# Patient Record
Sex: Female | Born: 1965
Health system: Southern US, Community
[De-identification: ages and names within clinical notes are randomized; demographics above are authoritative.]

## PROBLEM LIST (undated history)

## (undated) DIAGNOSIS — F419 Anxiety disorder, unspecified: Secondary | ICD-10-CM

## (undated) DIAGNOSIS — R87619 Unspecified abnormal cytological findings in specimens from cervix uteri: Secondary | ICD-10-CM

## (undated) DIAGNOSIS — F32A Depression, unspecified: Secondary | ICD-10-CM

## (undated) DIAGNOSIS — B977 Papillomavirus as the cause of diseases classified elsewhere: Secondary | ICD-10-CM

## (undated) DIAGNOSIS — Z8489 Family history of other specified conditions: Secondary | ICD-10-CM

## (undated) DIAGNOSIS — R51 Headache: Secondary | ICD-10-CM

## (undated) HISTORY — PX: SKIN BIOPSY: SHX1

## (undated) HISTORY — DX: Unspecified abnormal cytological findings in specimens from cervix uteri: R87.619

## (undated) HISTORY — DX: Papillomavirus as the cause of diseases classified elsewhere: B97.7

## (undated) HISTORY — PX: VARICOSE VEIN SURGERY: SHX832

## (undated) HISTORY — PX: WISDOM TOOTH EXTRACTION: SHX21

## (undated) HISTORY — DX: Headache: R51

---
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
## 1994-02-22 DIAGNOSIS — Z9889 Other specified postprocedural states: Secondary | ICD-10-CM

## 1994-02-22 HISTORY — DX: Other specified postprocedural states: Z98.890

## 1994-02-22 HISTORY — PX: LEEP: SHX91

## 1999-02-23 HISTORY — PX: RECTAL POLYPECTOMY: SHX2309

## 2000-01-12 ENCOUNTER — Other Ambulatory Visit: Admission: RE | Admit: 2000-01-12 | Discharge: 2000-01-12 | Payer: Self-pay | Admitting: Obstetrics and Gynecology

## 2000-01-12 ENCOUNTER — Encounter (INDEPENDENT_AMBULATORY_CARE_PROVIDER_SITE_OTHER): Payer: Self-pay

## 2000-02-22 ENCOUNTER — Encounter (INDEPENDENT_AMBULATORY_CARE_PROVIDER_SITE_OTHER): Payer: Self-pay | Admitting: Specialist

## 2000-02-22 ENCOUNTER — Other Ambulatory Visit: Admission: RE | Admit: 2000-02-22 | Discharge: 2000-02-22 | Payer: Self-pay | Admitting: Surgery

## 2001-04-14 ENCOUNTER — Other Ambulatory Visit: Admission: RE | Admit: 2001-04-14 | Discharge: 2001-04-14 | Payer: Self-pay | Admitting: Obstetrics and Gynecology

## 2001-08-28 ENCOUNTER — Ambulatory Visit (HOSPITAL_COMMUNITY): Admission: RE | Admit: 2001-08-28 | Discharge: 2001-08-28 | Payer: Self-pay | Admitting: Internal Medicine

## 2001-08-28 ENCOUNTER — Encounter: Payer: Self-pay | Admitting: Internal Medicine

## 2002-06-18 ENCOUNTER — Other Ambulatory Visit: Admission: RE | Admit: 2002-06-18 | Discharge: 2002-06-18 | Payer: Self-pay | Admitting: Obstetrics and Gynecology

## 2003-04-03 ENCOUNTER — Other Ambulatory Visit: Admission: RE | Admit: 2003-04-03 | Discharge: 2003-04-03 | Payer: Self-pay | Admitting: Family Medicine

## 2004-02-23 HISTORY — PX: HYSTEROSCOPY WITH D & C: SHX1775

## 2004-04-21 ENCOUNTER — Other Ambulatory Visit: Admission: RE | Admit: 2004-04-21 | Discharge: 2004-04-21 | Payer: Self-pay | Admitting: Family Medicine

## 2004-04-21 ENCOUNTER — Encounter: Admission: RE | Admit: 2004-04-21 | Discharge: 2004-04-21 | Payer: Self-pay | Admitting: Family Medicine

## 2004-06-23 ENCOUNTER — Encounter (INDEPENDENT_AMBULATORY_CARE_PROVIDER_SITE_OTHER): Payer: Self-pay | Admitting: Specialist

## 2004-06-23 ENCOUNTER — Ambulatory Visit (HOSPITAL_COMMUNITY): Admission: RE | Admit: 2004-06-23 | Discharge: 2004-06-23 | Payer: Self-pay | Admitting: Obstetrics and Gynecology

## 2005-05-10 ENCOUNTER — Other Ambulatory Visit: Admission: RE | Admit: 2005-05-10 | Discharge: 2005-05-10 | Payer: Self-pay | Admitting: Family Medicine

## 2005-05-18 ENCOUNTER — Encounter: Admission: RE | Admit: 2005-05-18 | Discharge: 2005-05-18 | Payer: Self-pay | Admitting: Family Medicine

## 2005-08-22 HISTORY — PX: NOVASURE ABLATION: SHX5394

## 2005-08-24 ENCOUNTER — Ambulatory Visit (HOSPITAL_COMMUNITY): Admission: RE | Admit: 2005-08-24 | Discharge: 2005-08-24 | Payer: Self-pay | Admitting: Gynecology

## 2005-08-24 ENCOUNTER — Encounter (INDEPENDENT_AMBULATORY_CARE_PROVIDER_SITE_OTHER): Payer: Self-pay | Admitting: *Deleted

## 2007-05-26 ENCOUNTER — Ambulatory Visit (HOSPITAL_COMMUNITY): Admission: RE | Admit: 2007-05-26 | Discharge: 2007-05-26 | Payer: Self-pay | Admitting: Family Medicine

## 2007-09-03 ENCOUNTER — Encounter: Admission: RE | Admit: 2007-09-03 | Discharge: 2007-09-03 | Payer: Self-pay | Admitting: Family Medicine

## 2008-07-04 ENCOUNTER — Other Ambulatory Visit: Admission: RE | Admit: 2008-07-04 | Discharge: 2008-07-04 | Payer: Self-pay | Admitting: Obstetrics & Gynecology

## 2008-07-24 ENCOUNTER — Encounter: Admission: RE | Admit: 2008-07-24 | Discharge: 2008-07-24 | Payer: Self-pay | Admitting: Obstetrics & Gynecology

## 2009-01-10 ENCOUNTER — Ambulatory Visit (HOSPITAL_COMMUNITY): Admission: RE | Admit: 2009-01-10 | Discharge: 2009-01-10 | Payer: Self-pay | Admitting: Family Medicine

## 2010-03-16 ENCOUNTER — Encounter: Payer: Self-pay | Admitting: Family Medicine

## 2010-03-26 ENCOUNTER — Other Ambulatory Visit: Payer: Self-pay | Admitting: Obstetrics & Gynecology

## 2010-03-26 DIAGNOSIS — Z1239 Encounter for other screening for malignant neoplasm of breast: Secondary | ICD-10-CM

## 2010-04-02 ENCOUNTER — Ambulatory Visit
Admission: RE | Admit: 2010-04-02 | Discharge: 2010-04-02 | Disposition: A | Discharge status: Home / Self Care | Payer: Commercial Managed Care - PPO | Source: Ambulatory Visit | Attending: Obstetrics & Gynecology | Admitting: Obstetrics & Gynecology

## 2010-04-02 DIAGNOSIS — Z1239 Encounter for other screening for malignant neoplasm of breast: Secondary | ICD-10-CM

## 2010-07-10 NOTE — Op Note (Signed)
NAME:  Taylor Townsend, Taylor Townsend               ACCOUNT NO.:  [id number]   MEDICAL RECORD NO.:  [id number]          PATIENT TYPE:  AMB   LOCATION:  SDC                           FACILITY:  [facility]   PHYSICIAN:  Guy Sandifer. Henderson Cloud, M.D. DATE OF BIRTH:  07/23/1965   DATE OF PROCEDURE:  06/23/2004  DATE OF DISCHARGE:                                 OPERATIVE REPORT   PREOPERATIVE DIAGNOSIS:  Menometrorrhagia.   POSTOPERATIVE DIAGNOSIS:  Menometrorrhagia.   PROCEDURES:  1.  Hysteroscopy with resection of endometrial polyps.  2.  Dilatation and curettage.  3.  Xylocaine 1% paracervical block.   SURGEON:  Guy Sandifer. Henderson Cloud, M.D.   ANESTHESIA:  MAC.   SPECIMENS:  1.  Endometrial polyps.  2.  Endometrial curettings.   ESTIMATED BLOOD LOSS:  Less than 100 mL.   I&O OF SORBITOL DISTENDING MEDIA:  50 mL deficit.   INDICATIONS AND CONSENT:  This patient is a 45 year old divorced white  female, G1, P1, with increasingly heavy menses.  Details are dictated in the  history and physical.  Hysteroscopy with resectoscope, dilatation and  curettage, has been discussed with the patient preoperatively.  Potential  risks and complications are discussed preoperatively, including but not  limited to infection, uterine perforation, bowel, bladder or ureteral  damage, bleeding requiring transfusion of blood products and possible  transfusion reaction, HIV and hepatitis acquisition, DVT, PE and pneumonia,  and recurrent abnormal bleeding.  All questions have been answered and  consent is signed on the chart.   FINDINGS:  There are two small polypoid processes on the posterior wall of  the uterine canal.   PROCEDURE:  The patient was taken to the operating room, where she is  identified, placed in the dorsal supine position, and given intravenous  sedation.  She was then placed in the dorsal lithotomy position, where she  was gently prepped, the bladder was straight-catheterized and she was draped  in a sterile  fashion.  Examination reveals an anteverted uterus.  The  bivalve speculum was then placed in the vagina and the anterior  cervical  lip is injected with 1% Xylocaine and grasped with a single-tooth tenaculum.  A paracervical block is then placed at the 2, 4, 5, 7, 8 and 10 o'clock  positions with approximately 20 mL total of 1% plain Xylocaine.  The cervix  is then gently progressively dilated to a 31 Pratt dilator.  The  resectoscope is then placed in the endocervical canal and advanced under  direct visualization using sorbitol distending media.  The above findings  were noted.  Using a single right  angle wire loop, the polypoid processes are resected without difficulty.  Reinspection reveals the remainder of the cavity to appear normal.  The  hysteroscope is removed and sharp curettage is carried out.  Good hemostasis  is noted.  All instruments are removed.  All counts correct.  The patient is  taken to the recovery room in stable condition.      JET/MEDQ  D:  06/23/2004  T:  06/23/2004  Job:  [id number]

## 2010-07-10 NOTE — H&P (Signed)
NAME:  Taylor Townsend, Taylor Townsend               ACCOUNT NO.:  [id number]   MEDICAL RECORD NO.:  [id number]           PATIENT TYPE:   LOCATION:                                 FACILITY:   PHYSICIAN:  Guy Sandifer. Henderson Cloud, M.D.      DATE OF BIRTH:   DATE OF ADMISSION:  06/23/2004  DATE OF DISCHARGE:                                HISTORY & PHYSICAL   CHIEF COMPLAINT:  Heavy vaginal bleeding.   HISTORY OF PRESENT ILLNESS:  This patient is a 45 year old divorced white  female, G1, P1, who has had increasingly heavy menses.  Pelvic ultrasound on  May 13, 2004 reveals the uterus measuring 9.7 x 4.3 x 5.5 cm.  Sonohystogram reveals at least 2 polypoid-shaped masses measuring 8 and 14  mm respectively.  There is a 1.6 cm simple cyst of the left ovary.  After  discussion of the options of management, she is being admitted for  hysteroscopy with resectoscope and D&C.  Potential risks and complications  have been reviewed preoperatively.   PAST MEDICAL HISTORY:  Cervical dysplasia.   PAST SURGICAL HISTORY:  Negative.   OBSTETRIC HISTORY:  Vaginal delivery x1.   FAMILY HISTORY:  Chronic hypertension in father and paternal grandmother.  Uterine cancer in maternal grandmother.   SOCIAL HISTORY:  Drinks alcohol on occasion.  Denies tobacco or drug abuse.   REVIEW OF SYSTEMS:  NEUROLOGIC:  Denies headache.  PULMONARY:  Denies  shortness of breath.  CARDIOVASCULAR:  Denies chest pain.  GI:  Denies  changes in bowel habits.   PHYSICAL EXAMINATION:  VITAL SIGNS:  Height 5 feet, 8.5 inches.  Blood  pressure 110/76.  HEENT:  Without thyromegaly.  LUNGS:  Clear to auscultation.  HEART:  Regular rate and rhythm.  BACK:  Without CVA tenderness.  BREASTS:  No examined.  ABDOMEN:  Soft, nontender, without masses.  PELVIC:  Vulva, vaginal, and cervix without lesions.  Uterus is anteverted,  upper limits of normal size, mobile, nontender.  Adnexa nontender without  masses.  EXTREMITIES:  Neurological exam  grossly within normal limits.   ASSESSMENT:  Menometrorrhagia.   PLAN:  Hysteroscopy D&C with resectoscope.      JET/MEDQ  D:  06/22/2004  T:  06/22/2004  Job:  [id number]

## 2010-07-10 NOTE — Op Note (Signed)
NAME:  Taylor Townsend, Taylor Townsend               ACCOUNT NO.:  [id number]   MEDICAL RECORD NO.:  [id number]          PATIENT TYPE:  AMB   LOCATION:  SDC                           FACILITY:  [facility]   PHYSICIAN:  Ivor Costa. Farrel Gobble, M.D. DATE OF BIRTH:  [birth year]   DATE OF PROCEDURE:  08/24/2005  DATE OF DISCHARGE:                                 OPERATIVE REPORT   PREOPERATIVE DIAGNOSIS:  Menorrhagia.   POSTOPERATIVE DIAGNOSIS:  Menorrhagia.   PROCEDURE:  D&C hysteroscopy.  Additional procedure was Novasure ablation.   SURGEON:  Ivor Costa. Farrel Gobble, M.D.   ANESTHESIA:  General plus paracervical block of 10 mL of 0.25% Marcaine.   ESTIMATED BLOOD LOSS:  Minimal.   I&O DEFICIT:  3% sorbitol solution 50.   FINDINGS:  Anteflexed uterus that measured 9 cm, the cervix measured 4, the  cavity therefore being 5, there were normal contours.   PATHOLOGY:  Endometrial curettings.   COMPLICATIONS:  None.   PROCEDURE:  The patient was taken to operating room.  General anesthesia was  induced, then placed in dorsal lithotomy position, prepped and draped usual  sterile fashion.  A bivalve speculum was placed in the vagina.  The cervix  was visualized and stabilized with a single-tooth tenaculum. 10 mL of 0.25%  Marcaine was then injected circumferentially around the cervix.  The uterine  sound was used to measure the cavity and confirm orientation.  The larger 19-  mm sound was then used to palpate the endocervical os.  Four measurements  were taken, two with the smaller dilator, one with the Hegar, the Hegar  measuring 4 and 4 was therefore used for the case.  The cervix was then  dilated to 21.  The cervix was grasped inferiorly to straighten the canal.  The cavity was inspected, noted to be unremarkable other than what appeared  to be artifact and dilators.  A gentle scraping was performed for specimen.  The cervix was then resounded for 4 using the number 8 Hegar dilator.  The  Novasure instrument was  then advanced through the cervix and towards the  fundus after it had been set for cervical length of 4.  The Novasure was  released into the cavity and gentle traction back showed good seating, the  cavity width was noted to be 3.1.  The Novasure was then seated using the  usual maneuvers.  The CO2 test performed confirmed good seal.  The  instrument was then turned on with a power of 85 for a total burn time of 2  minutes, after which the instrument was released and removed from the  cavity.  The screen was inspected.  The diagnostic scope was then advanced  back  through the cavity.  A good thermal burn was appreciated throughout with a  good margin at the endocervix.  The patient tolerated the procedure well.  Sponge, lap and needle counts were correct and she was extubated in OR,  transferred to the PACU in stable condition.      Ivor Costa. Farrel Gobble, M.D.  Electronically Signed     THL/MEDQ  D:  08/24/2005  T:  08/24/2005  Job:  [id number]

## 2012-04-05 ENCOUNTER — Encounter: Payer: Self-pay | Admitting: Certified Nurse Midwife

## 2012-05-15 ENCOUNTER — Encounter: Payer: Self-pay | Admitting: Certified Nurse Midwife

## 2012-05-15 ENCOUNTER — Ambulatory Visit (INDEPENDENT_AMBULATORY_CARE_PROVIDER_SITE_OTHER): Payer: Commercial Managed Care - PPO | Admitting: Certified Nurse Midwife

## 2012-05-15 VITALS — BP 114/72 | HR 60 | Resp 16

## 2012-05-15 DIAGNOSIS — Z711 Person with feared health complaint in whom no diagnosis is made: Secondary | ICD-10-CM

## 2012-05-15 NOTE — Progress Notes (Signed)
46 yrsDivorcedCaucasian femaleG1P1001 here for f/u positive chlamydia treated with azithromycin, initiated on 02-14-12.  Completed all med as directed.  Not sexually active.  Patient's last menstrual period was 04/20/2012.                                    Contraception:The current method of family planning is OCP (estrogen/progesterone).                                                                                                                                                                                                                                                           Patient:She denies abnormal vaginal bleeding, discharge or unusual pelvic pain, no dysuria, frequency or hematuria.                                                                                                                                     General:Healthy female time, place and person  X 3 Abdomen: soft non-tender Lymph groin:Normal   Pelvic Exam:EGBUS normal. Vulva reveals no erythema, lesions or atrophic changes. Vagina normal, no lesions, polyps or discharge. Cervix is normal, smooth pink mucosa, no friability, nontender, no CMT, no lesions, no polyps. Cervical ectropion negative, stenosis negative, Nabothian cysts negative. Uterus not palpated: Adnexae:not palpated  Specimens collected                    Assessment:STD rescreen [facility]) Contraception: LoLoestrin Fe  (has rx)                                         Plan :Reviewed findings GC,Chl,   Hepatitis C antibody         STD prevention reviewed.  Questions addressed.         Repeat  STD screen 9 months, at aex Instructions given to patient             Copy printed for patient   :   :      :       Red Flags:  Missed period:  Recent antibiotics:  Possible STD exposure:  IUD:  Diabetes:

## 2012-05-16 LAB — HEPATITIS C ANTIBODY: HCV Ab: NEGATIVE

## 2012-05-16 NOTE — Progress Notes (Signed)
Quick Note:  Reviewed result. Please notify patient result neg. Debbi ______

## 2012-05-26 ENCOUNTER — Encounter: Payer: Self-pay | Admitting: Certified Nurse Midwife

## 2012-05-29 ENCOUNTER — Other Ambulatory Visit: Payer: Self-pay

## 2012-05-29 DIAGNOSIS — Z1231 Encounter for screening mammogram for malignant neoplasm of breast: Secondary | ICD-10-CM

## 2012-06-16 ENCOUNTER — Other Ambulatory Visit: Payer: Self-pay | Admitting: Obstetrics & Gynecology

## 2012-06-16 NOTE — Telephone Encounter (Signed)
Call to patient since Rx for Treximet was given in 01-2012.  Patient states it is expensive so she prefers the generic Sumatriptan and adding Naproxen.  Patient has enough until next menses so aware we will confirm with Debbi and complete on Monday.

## 2012-06-16 NOTE — Telephone Encounter (Signed)
Inbasket request for Imitrex chart shows she was changed to Treximet in Dec with DL please advise. Also has Naproxen 500 mg request.

## 2012-06-20 NOTE — Telephone Encounter (Signed)
rxs approved see note

## 2012-06-20 NOTE — Telephone Encounter (Signed)
OK to change to generic form with refills to next aex

## 2012-06-20 NOTE — Telephone Encounter (Signed)
Araceli Bouche, CMA at 06/20/2012  5:11 PM    Status: Signed                   rxs approved see note         Verner Chol, CNM at 06/20/2012  8:11 AM    Status: Signed                   OK to change to generic form with refills to next aex         Armen Pickup, RN at 06/16/2012  4:58 PM    Status: Signed                   Call to patient since Rx for Treximet was given in 01-2012.  Patient states it is expensive so she prefers the generic Sumatriptan and adding Naproxen.  Patient has enough until next menses so aware we will confirm with Debbi and complete on Monday.         Dorinda Hill, CMA at 06/16/2012  3:47 PM    Status: Signed                   Inbasket request for Imitrex chart shows she was changed to Treximet in Dec with DL please advise. Also has Naproxen 500 mg request.

## 2012-06-26 ENCOUNTER — Ambulatory Visit
Admission: RE | Admit: 2012-06-26 | Discharge: 2012-06-26 | Disposition: A | Discharge status: Home / Self Care | Payer: 59 | Source: Ambulatory Visit

## 2012-06-26 DIAGNOSIS — Z1231 Encounter for screening mammogram for malignant neoplasm of breast: Secondary | ICD-10-CM

## 2013-05-10 ENCOUNTER — Ambulatory Visit: Payer: Self-pay | Admitting: Obstetrics & Gynecology

## 2013-05-10 ENCOUNTER — Encounter: Payer: Self-pay | Admitting: Obstetrics & Gynecology

## 2013-05-21 ENCOUNTER — Encounter: Payer: Self-pay | Admitting: Obstetrics & Gynecology

## 2013-05-21 ENCOUNTER — Ambulatory Visit (INDEPENDENT_AMBULATORY_CARE_PROVIDER_SITE_OTHER): Payer: Commercial Managed Care - PPO | Admitting: Obstetrics & Gynecology

## 2013-05-21 VITALS — BP 112/62 | HR 60 | Resp 16 | Ht 69.0 in | Wt 140.2 lb

## 2013-05-21 DIAGNOSIS — Z01419 Encounter for gynecological examination (general) (routine) without abnormal findings: Secondary | ICD-10-CM

## 2013-05-21 DIAGNOSIS — Z23 Encounter for immunization: Secondary | ICD-10-CM

## 2013-05-21 DIAGNOSIS — Z0000 Encounter for general adult medical examination without abnormal findings: Secondary | ICD-10-CM

## 2013-05-21 DIAGNOSIS — Z124 Encounter for screening for malignant neoplasm of cervix: Secondary | ICD-10-CM

## 2013-05-21 LAB — POCT URINALYSIS DIPSTICK
Bilirubin, UA: NEGATIVE
Blood, UA: NEGATIVE
Glucose, UA: NEGATIVE
Ketones, UA: NEGATIVE
Leukocytes, UA: NEGATIVE
Nitrite, UA: NEGATIVE
Protein, UA: NEGATIVE
Urobilinogen, UA: NEGATIVE
pH, UA: 7

## 2013-05-21 LAB — HEMOGLOBIN, FINGERSTICK: Hemoglobin, fingerstick: 13.5 g/dL (ref 12.0–16.0)

## 2013-05-21 NOTE — Patient Instructions (Signed)

## 2013-05-21 NOTE — Progress Notes (Signed)
48 y.o. G1P1001 DivorcedCaucasianF here for annual exam.  Daughter is studying abroad in [place].  Going to go visit daughter after her program is over.      Cycles are changing.  Has had cycles every 3 to 6 weeks.  Flow is changing again.  Migraines are so much better.  Hasn't taken any medication for headaches in 9 months.  Stopped the LoLoestrin a year ago and this helped as well.  Dr. Sabra Heck does blood work.   Patient's last menstrual period was 04/07/2013.          Sexually active: yes  The current method of family planning is vasectomy.    Exercising: yes  somewhat walk and jog Smoker:  no  Health Maintenance: Pap:  02/08/12-WNL History of abnormal Pap:  Yes, h/o cryosurgery and LEEP MMG:  06/26/12 3D-normal Colonoscopy:  none BMD:   Age 30-normal TDaP:  2005 Screening Labs: Dr. Kathyrn Lass, Hb today: 13.5, Urine today: PH-7.0   reports that she has never smoked. She has never used smokeless tobacco. She reports that she drinks alcohol. She reports that she does not use illicit drugs.  Past Medical History  Diagnosis Date  . Headache(784.0)     menstrual migraines  . HPV (human papilloma virus) infection     condyloma  . Abnormal Pap smear of cervix     Past Surgical History  Procedure Laterality Date  . Novasure ablation  7/07  . Polypectomy  2006  . Rectal polypectomy  2001    anal polyp  . 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 Cervix lesion destruction  1984  . Gynecologic cryosurgery    . Varicose vein surgery      Current Outpatient Prescriptions  Medication Sig Dispense Refill  . cetirizine (ZYRTEC) 10 MG tablet Take 10 mg by mouth daily. As needed      . fluticasone (FLONASE) 50 MCG/ACT nasal spray Place 2 sprays into the nose daily.      . polyethylene glycol (MIRALAX / GLYCOLAX) packet Take 17 g by mouth daily.      Cyndie Chime Estrad-Fe Biphas (LO LOESTRIN FE PO) Take by mouth daily.      . SUMAtriptan-naproxen (TREXIMET) 85-500 MG per tablet Take 1 tablet by  mouth every 2 (two) hours as needed for migraine.       No current facility-administered medications for this visit.    Family History  Problem Relation Age of Onset  . Cancer Maternal Grandmother     endometrial  . Breast cancer Mother   . Pancreatic cancer Maternal Grandfather   . Hypertension Mother   . Hypertension Father   . Hypertension Other     all grandparents  . Coronary artery disease Mother   . Coronary artery disease Father   . Bipolar disorder Mother   . Bipolar disorder Brother   . Osteoporosis Mother     ROS:  Pertinent items are noted in HPI.  Otherwise, a comprehensive ROS was negative.  Exam:   BP 112/62  Pulse 60  Resp 16  Ht 5\' 9"  (1.753 m)  Wt 140 lb 3.2 oz (63.594 kg)  BMI 20.69 kg/m2  LMP 04/07/2013  Weight change:-3#  Height: 5\' 9"  (175.3 cm)  Ht Readings from Last 3 Encounters:  05/21/13 5\' 9"  (1.753 m)    General appearance: alert, cooperative and appears stated age Head: Normocephalic, without obvious abnormality, atraumatic Neck: no adenopathy, supple, symmetrical, trachea midline and thyroid normal to inspection and  palpation Lungs: clear to auscultation bilaterally Breasts: normal appearance, no masses or tenderness Heart: regular rate and rhythm Abdomen: soft, non-tender; bowel sounds normal; no masses,  no organomegaly Extremities: extremities normal, atraumatic, no cyanosis or edema Skin: Skin color, texture, turgor normal. No rashes or lesions Lymph nodes: Cervical, supraclavicular, and axillary nodes normal. No abnormal inguinal nodes palpated Neurologic: Grossly normal   Pelvic: External genitalia:  no lesions              Urethra:  normal appearing urethra with no masses, tenderness or lesions              Bartholins and Skenes: normal                 Vagina: normal appearing vagina with normal color and discharge, no lesions              Cervix: no lesions              Pap taken: yes Bimanual Exam:  Uterus:  normal size,  contour, position, consistency, mobility, non-tender              Adnexa: normal adnexa and no mass, fullness, tenderness               Rectovaginal: Confirms               Anus:  normal sphincter tone, no lesions  A:  Well Woman with normal exam Migraines, much improved H/O abnormal pap with cryo and then LEEP 1996  P:   Mammogram yearly pap smear obtained Tdap today Pt does not need RX for Treximet.  Will call if does. return annually or prn  An After Visit Summary was printed and given to the patient.

## 2013-05-23 ENCOUNTER — Ambulatory Visit: Payer: Commercial Managed Care - PPO | Admitting: Obstetrics & Gynecology

## 2013-05-23 LAB — IPS PAP SMEAR ONLY

## 2013-12-10 ENCOUNTER — Other Ambulatory Visit: Payer: Self-pay

## 2013-12-10 DIAGNOSIS — Z1231 Encounter for screening mammogram for malignant neoplasm of breast: Secondary | ICD-10-CM

## 2013-12-24 ENCOUNTER — Encounter: Payer: Self-pay | Admitting: Obstetrics & Gynecology

## 2013-12-27 ENCOUNTER — Ambulatory Visit
Admission: RE | Admit: 2013-12-27 | Discharge: 2013-12-27 | Disposition: A | Discharge status: Home / Self Care | Payer: 59 | Source: Ambulatory Visit

## 2013-12-27 DIAGNOSIS — Z1231 Encounter for screening mammogram for malignant neoplasm of breast: Secondary | ICD-10-CM

## 2014-02-14 ENCOUNTER — Other Ambulatory Visit: Payer: Self-pay | Admitting: Obstetrics & Gynecology

## 2014-02-18 NOTE — Telephone Encounter (Signed)
Medication refill request: Imitrex 100 mg  Last AEX:  05/21/13 with Dr. Sabra Heck  Next AEX: 05/31/14 with Dr. Sabra Heck Last MMG (if hormonal medication request): N/A  Refill authorized: #[id number] rfs, please advise.

## 2014-05-31 ENCOUNTER — Ambulatory Visit: Payer: Commercial Managed Care - PPO | Admitting: Obstetrics & Gynecology

## 2014-08-05 ENCOUNTER — Other Ambulatory Visit: Payer: Self-pay | Admitting: Gastroenterology

## 2014-09-23 ENCOUNTER — Other Ambulatory Visit: Payer: Self-pay | Admitting: *Deleted

## 2014-09-23 NOTE — Telephone Encounter (Signed)
Opened in error

## 2014-12-17 ENCOUNTER — Ambulatory Visit (INDEPENDENT_AMBULATORY_CARE_PROVIDER_SITE_OTHER): Payer: 59 | Admitting: Obstetrics & Gynecology

## 2014-12-17 ENCOUNTER — Encounter: Payer: Self-pay | Admitting: Obstetrics & Gynecology

## 2014-12-17 VITALS — BP 110/78 | HR 60 | Resp 16 | Ht 69.25 in | Wt 151.0 lb

## 2014-12-17 DIAGNOSIS — N926 Irregular menstruation, unspecified: Secondary | ICD-10-CM | POA: Diagnosis not present

## 2014-12-17 DIAGNOSIS — Z124 Encounter for screening for malignant neoplasm of cervix: Secondary | ICD-10-CM

## 2014-12-17 DIAGNOSIS — N393 Stress incontinence (female) (male): Secondary | ICD-10-CM | POA: Diagnosis not present

## 2014-12-17 DIAGNOSIS — Z01419 Encounter for gynecological examination (general) (routine) without abnormal findings: Secondary | ICD-10-CM

## 2014-12-17 DIAGNOSIS — Z0000 Encounter for general adult medical examination without abnormal findings: Secondary | ICD-10-CM | POA: Diagnosis not present

## 2014-12-17 MED ORDER — SUMATRIPTAN SUCCINATE 100 MG PO TABS: 100.0000 mg | ORAL_TABLET | ORAL | Status: DC | PRN

## 2014-12-17 MED ORDER — NAPROXEN 500 MG PO TABS
ORAL_TABLET | ORAL | Status: DC
Start: 2014-12-17 — End: 2017-05-16

## 2014-12-17 NOTE — Progress Notes (Signed)
49 y.o. G57P1001 Divorced CaucasianF here for annual exam.  Pt reports cycles are changing.  She can skip up to two months and the subsequent month will then have two or three episodes of light spotting.  Rarely has a heavy day of bleeding now.  Pt anxious about presence of abnormal cells causing bleeding.  H/O endometrial ablation.  Reports urinary incontinence has worsened.  She is ready to consider treatment options.  Surgery vs PT reviewed.  Recommend PT for considering surgery.  Pt in agreement.  Order will be placed.    PCP:  Kathyrn Lass.  Did blood work in March.  Patient's last menstrual period was 11/10/2014.          Sexually active: Yes.    The current method of family planning is vasectomy.    Exercising: Yes.    walking and jogging Smoker:  no  Health Maintenance: Pap: 02/08/12-WNL History of abnormal Pap: Yes, h/o cryosurgery and LEEP MMG:11/15 3D-normal Colonoscopy: Dr. Watt Climes.  One adenomatous polyp.  Follow up five years.  BMD: Age 14-normal TDaP: 2015 Screening Labs: Dr. Kathyrn Lass, Hb today: 13.5, Urine today: PH-7.0   reports that she has never smoked. She has never used smokeless tobacco. She reports that she drinks alcohol. She reports that she does not use illicit drugs.  Past Medical History  Diagnosis Date  . Headache(784.0)     menstrual migraines  . HPV (human papilloma virus) infection     condyloma  . Abnormal Pap smear of cervix     Past Surgical History  Procedure Laterality Date  . Novasure ablation  7/07  . Hysteroscopy w/d&c  2006  . Rectal polypectomy  2001    anal polyp  . 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 Gynecologic cryosurgery  1984  . Varicose vein surgery    . Skin biopsy      area froze off-left shoulder    Current Outpatient Prescriptions  Medication Sig Dispense Refill  . cetirizine (ZYRTEC) 10 MG tablet Take 10 mg by mouth daily. As needed    . fluticasone (FLONASE) 50 MCG/ACT nasal spray Place 2 sprays into the nose daily.     Marland Kitchen PATADAY 0.2 % SOLN   4  . polyethylene glycol (MIRALAX / GLYCOLAX) packet Take 17 g by mouth daily.    . SUMAtriptan (IMITREX) 100 MG tablet TAKE 1 TABLET BY MOUTH AT ONSET OF HEADACHE. MAY REPEAT IN 2 HOURS. MAX DOSE 200MG /24 HOURS. 12 tablet 2   No current facility-administered medications for this visit.    Family History  Problem Relation Age of Onset  . Cancer Maternal Grandmother     endometrial  . Breast cancer Mother   . Pancreatic cancer Maternal Grandfather   . Hypertension Mother   . Hypertension Father   . Hypertension Other     all grandparents  . Coronary artery disease Mother   . Coronary artery disease Father   . Bipolar disorder Mother   . Bipolar disorder Brother   . Osteoporosis Mother     ROS:  Pertinent items are noted in HPI.  Otherwise, a comprehensive ROS was negative.  Exam:   BP 110/78 mmHg  Pulse 60  Resp 16  Ht 5' 9.25" (1.759 m)  Wt 151 lb (68.493 kg)  BMI 22.14 kg/m2  LMP 11/10/2014  :   Height: 5' 9.25" (175.9 cm)  Ht Readings from Last 3 Encounters:  12/17/14 5' 9.25" (1.759 m)  05/21/13 5\' 9"  (1.753  m)    General appearance: alert, cooperative and appears stated age Head: Normocephalic, without obvious abnormality, atraumatic Neck: no adenopathy, supple, symmetrical, trachea midline and thyroid normal to inspection and palpation Lungs: clear to auscultation bilaterally Breasts: normal appearance, no masses or tenderness Heart: regular rate and rhythm Abdomen: soft, non-tender; bowel sounds normal; no masses,  no organomegaly Extremities: extremities normal, atraumatic, no cyanosis or edema Skin: Skin color, texture, turgor normal. No rashes or lesions Lymph nodes: Cervical, supraclavicular, and axillary nodes normal. No abnormal inguinal nodes palpated Neurologic: Grossly normal   Pelvic: External genitalia:  no lesions              Urethra:  normal appearing urethra with no masses, tenderness or lesions               Bartholins and Skenes: normal                 Vagina: normal appearing vagina with normal color and discharge, no lesions              Cervix: no lesions              Pap taken: Yes.   Bimanual Exam:  Uterus:  normal size, contour, position, consistency, mobility, non-tender              Adnexa: normal adnexa and no mass, fullness, tenderness               Rectovaginal: Confirms               Anus:  normal sphincter tone, no lesions  Endometrial biopsy recommended.  Discussed with patient.  Verbal and written consent obtained.   Procedure:  Speculum placed.  Cervix visualized and cleansed with betadine prep.  A single toothed tenaculum was not applied to the anterior lip of the cervix.  Endometrial pipelle was advanced through the cervix into the endometrial cavity without difficulty.  Pipelle passed to 6cm.  Suction applied and pipelle removed with good tissue sample obtained.  Tenculum removed.  No bleeding noted.  Patient tolerated procedure well.  Chaperone was present for exam.  A:  Well Woman with normal exam Migraines, much improved H/O abnormal pap with cryo and then LEEP 1996 Increasing SUI with sensation of incomplete emptying Increasing DUB H/O breast cancer in mother H/O endometrial cancer in [facility]  P: Mammogram yearly pap smear obtained today.  H/O  Endo biopsy performed today.  Pt will be called with results. Imitrex rx to pharmacy.  100mg  at HA onset, repeat in 2 hours.  Max dosage 200mg /24 hrs.  Also, Naprosyn rx 500mg  to pharmacy to use with Imitrex for HA.  (used Treximet in past with good success but became very costly). PT referral placed  In additional AEX, about 10 extra minutes spent in discussion of SUI and irregular bleeding as well as evaluation and treatment options.

## 2014-12-18 LAB — IPS PAP TEST WITH REFLEX TO HPV

## 2014-12-26 ENCOUNTER — Telehealth: Payer: Self-pay | Admitting: Emergency Medicine

## 2014-12-26 NOTE — Telephone Encounter (Signed)
-----   Message from Megan Salon, MD sent at 12/24/2014 10:59 AM EDT ----- Inform endometrial biopsy showed normal cells.  Nothing else needed unless bleeding becomes more irregular.

## 2014-12-26 NOTE — Telephone Encounter (Signed)
Message left to return call to Kearney Evitt at [phone].    

## 2014-12-31 ENCOUNTER — Encounter: Payer: Self-pay | Admitting: Physical Therapy

## 2014-12-31 ENCOUNTER — Ambulatory Visit: Payer: 59 | Attending: Obstetrics & Gynecology | Admitting: Physical Therapy

## 2014-12-31 DIAGNOSIS — M6289 Other specified disorders of muscle: Secondary | ICD-10-CM

## 2014-12-31 DIAGNOSIS — N8184 Pelvic muscle wasting: Secondary | ICD-10-CM | POA: Insufficient documentation

## 2014-12-31 DIAGNOSIS — N907 Vulvar cyst: Secondary | ICD-10-CM | POA: Insufficient documentation

## 2014-12-31 DIAGNOSIS — N8189 Other female genital prolapse: Secondary | ICD-10-CM

## 2014-12-31 NOTE — Patient Instructions (Signed)
Quick Contraction: Gravity Eliminated (Hook-Lying)    Lie with hips and knees bent. Quickly squeeze then fully relax pelvic floor. Perform _1__ sets of _5__. Rest for _5__ seconds between sets. Do _3__ times a day.   Copyright  VHI. All rights reserved.  Slow Contraction: Gravity Eliminated (Hook-Lying)    Lie with hips and knees bent. Slowly squeeze pelvic floor for _5__ seconds. Rest for 5___ seconds. Repeat _3__ times. Do _3__ times a day.   Copyright  VHI. All rights reserved.  Certain foods and liquids will decrease the pH making the urine more acidic.  Urinary urgency increases when the urine has a low pH.  Most common irritants: alcohol, carbonated beverages and caffinated beverages.  Foods to avoid: apple juice, apples, ascorbic acid, canteloupes, chili, citrus fruits, coffee, cranberries, grapes, guava, peaches, pepper, pineapple, plums, strawberries, tea, tomatoes, and vinegar.  Drinking plenty of water may help to increase the pH and dilute out any of the effects of specific irritants.  Foods that are NOT irritating to the bladder include: Pears, papayas, sun-brewed teas, watermelons, non-citrus herbal teas, apricots, kava and low-acid instant drinks (Postum)  [facility] [address], [place], [place] [address] Phone # [phone] Fax [phone]

## 2014-12-31 NOTE — Therapy (Signed)
[facility] [address] [place], [place], [address] Phone: [phone]   Fax:  [phone]  Physical Therapy Evaluation  Patient Details  Name: Taylor Townsend MRN: [id number] Date of Birth: 09-09-1965 Referring Provider: Dr. Hale Bogus  Encounter Date: 12/31/2014      PT End of Session - 12/31/14 1720    Visit Number 1   Date for PT Re-Evaluation 04/30/15   PT Start Time 1530   PT Stop Time 1610   PT Time Calculation (min) 40 min   Activity Tolerance Patient tolerated treatment well   Behavior During Therapy [facility] for tasks assessed/performed      Past Medical History  Diagnosis Date  . Headache(784.0)     menstrual migraines  . HPV (human papilloma virus) infection     condyloma  . Abnormal Pap smear of cervix     Past Surgical History  Procedure Laterality Date  . Novasure ablation  7/07  . Hysteroscopy w/d&c  2006  . Rectal polypectomy  2001    anal polyp  . 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 Gynecologic cryosurgery  1984  . Varicose vein surgery    . Skin biopsy      area froze off-left shoulder    There were no vitals filed for this visit.  Visit Diagnosis:  Perineal floor weakness - Plan: PT plan of care cert/re-cert  Pelvic floor dysfunction - Plan: PT plan of care cert/re-cert      Subjective Assessment - 12/31/14 1537    Subjective Patient reports for 2 years she leaks urine with laughing, sneezing, running, coughing. Last year feels her bladder does not completly empty.  When she goes to the bathroom and when she stands up urine will leak out.    Patient Stated Goals strengthen the pelvic muscles, reduce urinary leakage.   Currently in Pain? No/denies            [facility] PT Assessment - 12/31/14 0001    Assessment   Medical Diagnosis N39.3 SUI   Referring Provider Dr. Hale Bogus   Onset Date/Surgical Date 02/23/12   Prior Therapy None   Precautions   Precautions None   Balance Screen    Has the patient fallen in the past 6 months No   Has the patient had a decrease in activity level because of a fear of falling?  No   Is the patient reluctant to leave their home because of a fear of falling?  No   Prior Function   Level of Independence Independent   Vocation Part time employment   Cognition   Overall Cognitive Status Within Functional Limits for tasks assessed   Observation/Other Assessments   Focus on Therapeutic Outcomes (FOTO)  29% limitation   ROM / Strength   AROM / PROM / Strength AROM;Strength   AROM   Overall AROM Comments lumbar ROM is full   Strength   Overall Strength Comments bil. hip abduction 4/5   Palpation   SI assessment  sacrum rotated right; left ilium is posteriorly rotated                 Pelvic Floor Special Questions - 12/31/14 0001    Prior Pregnancies Yes   Number of Pregnancies 1   Number of Vaginal Deliveries 1   Episiotomy Performed Yes  had alot of bleeding   Currently Sexually Active Yes   Is this Painful No   Urinary Leakage Yes  Pad use 2  0 at night   Activities that cause leaking Coughing;Sneezing;Laughing;Running;Exercising;With strong urge;Other  after getting up from commode   Urinary urgency Yes   Urinary frequency varies per day   Caffeine beverages 6   Falling out feeling (prolapse) No   Pelvic Floor Internal Exam patient confirms identification approved therapist to assess pelvic floor strength   Exam Type Vaginal   Palpation No tenderness   Strength fair squeeze, definite lift   Strength # of reps 1   Strength # of seconds 7                  PT Education - 12/31/14 1720    Education provided Yes   Education Details pelvic floor strengthening   Person(s) Educated Patient   Methods Explanation;Demonstration;Verbal cues;Handout   Comprehension Returned demonstration;Verbalized understanding          PT Short Term Goals - 01/01/15 0751    PT SHORT TERM GOAL #1   Title independent  with initial HEP for pelvic floor exercises.    Time 4   Period Weeks   Status New   PT SHORT TERM GOAL #2   Title pelvic floor strength 4/5 to reduce urinary leakage decreased >/= 25%   Time 4   Period Weeks   Status New   PT SHORT TERM GOAL #3   Title understand on how to not bear down when coughing and laughing   Time 4   Period Weeks   Status New           PT Long Term Goals - 01/01/15 4103    PT LONG TERM GOAL #1   Title independent with HEP and how to progress    Time 4   Period Months   Status New   PT LONG TERM GOAL #2   Title urniary leakage with laughing, coughing, and sneezing decreased >/= 75%   Time 4   Period Months   Status New   PT LONG TERM GOAL #3   Title able to urinate 1 time and not leak urine after she gets up from the commode   Time 4   Period Months   Status New   PT LONG TERM GOAL #4   Title pelvic floor strength 5/5 so she can return to an exercise program  and not leak urine   Time 4   Period Months   Status New   PT LONG TERM GOAL #5   Title not have to wear a pad due to reduction of urinary leakage   Time 4   Period Months   Status New               Plan - 12/31/14 1721    Clinical Impression Statement Patient is a 49 year old female with diagnosis of stress urinary incontinence that began suddenly 2 years ago. Patient reports no pain. Patient weas 2 pads per day but none at night. Patient has urinary leakage with strong urge to void, laughing, coughing, sneezing, running, and standing up from commode. Patient reports she does not feel like she  fully emptys her bladder.  Pelvic floor strength is 3/5  and hold for 7 sec one time.  When patient laughs or coughs she will bear down.  Patient reports difficulty with constipation.  Patient  benefit from  physical therapy to increase pelvic floor strength  to decrease urinary eakage.    Pt will benefit from skilled therapeutic intervention in order to improve  on the following deficits  Decreased strength;Decreased endurance;Decreased coordination   Rehab Potential Excellent   Clinical Impairments Affecting Rehab Potential None   PT Frequency 1x / week   PT Duration Other (comment)  4 months   PT Treatment/Interventions Electrical Stimulation;Therapeutic exercise;Therapeutic activities;Neuromuscular re-education;Patient/family education;Manual techniques   PT Next Visit Plan pelvic floor exercises, abdominal massage, toileting technique, relaxing technique   PT Home Exercise Plan pelvic floor exercise   Recommended Other Services None   Consulted and Agree with Plan of Care Patient         Problem List Patient Active Problem List   Diagnosis Date Noted  . SUI (stress urinary incontinence, female) 12/17/2014    Okie Jansson,PT 01/01/2015, 7:57 AM  [facility] [address] [place], [place], [address] Phone: [phone]   Fax:  [phone]  Name: Taylor Townsend MRN: [id number] Date of Birth: 1965/04/16

## 2015-01-02 ENCOUNTER — Ambulatory Visit: Payer: Commercial Managed Care - PPO | Admitting: Obstetrics & Gynecology

## 2015-01-07 ENCOUNTER — Encounter: Payer: 59 | Admitting: Physical Therapy

## 2015-01-14 ENCOUNTER — Encounter: Payer: 59 | Admitting: Physical Therapy

## 2015-01-21 ENCOUNTER — Encounter: Payer: Self-pay | Admitting: Physical Therapy

## 2015-01-21 ENCOUNTER — Ambulatory Visit: Payer: 59 | Admitting: Physical Therapy

## 2015-01-21 DIAGNOSIS — N8189 Other female genital prolapse: Secondary | ICD-10-CM

## 2015-01-21 DIAGNOSIS — N907 Vulvar cyst: Secondary | ICD-10-CM | POA: Diagnosis not present

## 2015-01-21 DIAGNOSIS — M6289 Other specified disorders of muscle: Secondary | ICD-10-CM

## 2015-01-21 NOTE — Patient Instructions (Signed)
About Abdominal Massage  Abdominal massage, also called external colon massage, is a self-treatment circular massage technique that can reduce and eliminate gas and ease constipation. The colon naturally contracts in waves in a clockwise direction starting from inside the right hip, moving up toward the ribs, across the belly, and down inside the left hip.  When you perform circular abdominal massage, you help stimulate your colon's normal wave pattern of movement called peristalsis.  It is most beneficial when done after eating.  Positioning You can practice abdominal massage with oil while lying down, or in the shower with soap.  Some people find that it is just as effective to do the massage through clothing while sitting or standing.  How to Massage Start by placing your finger tips or knuckles on your right side, just inside your hip bone.  . Make small circular movements while you move upward toward your rib cage.   . Once you reach the bottom right side of your rib cage, take your circular movements across to the left side of the bottom of your rib cage.  . Next, move downward until you reach the inside of your left hip bone.  This is the path your feces travel in your colon. . Continue to perform your abdominal massage in this pattern for 10 minutes each day.     You can apply as much pressure as is comfortable in your massage.  Start gently and build pressure as you continue to practice.  Notice any areas of pain as you massage; areas of slight pain may be relieved as you massage, but if you have areas of significant or intense pain, consult with your healthcare provider.  Other Considerations . General physical activity including bending and stretching can have a beneficial massage-like effect on the colon.  Deep breathing can also stimulate the colon because breathing deeply activates the same nervous system that supplies the colon.   . Abdominal massage should always be used in  combination with a bowel-conscious diet that is high in the proper type of fiber for you, fluids (primarily water), and a regular exercise program.  Toileting Techniques for Bowel Movements (Defecation) Using your belly (abdomen) and pelvic floor muscles to have a bowel movement is usually instinctive.  Sometimes people can have problems with these muscles and have to relearn proper defecation (emptying) techniques.  If you have weakness in your muscles, organs that are falling out, decreased sensation in your pelvis, or ignore your urge to go, you may find yourself straining to have a bowel movement.  You are straining if you are: . holding your breath or taking in a huge gulp of air and holding it  . keeping your lips and jaw tensed and closed tightly . turning red in the face because of excessive pushing or forcing . developing or worsening your  hemorrhoids . getting faint while pushing . not emptying completely and have to defecate many times a day  If you are straining, you are actually making it harder for yourself to have a bowel movement.  Many people find they are pulling up with the pelvic floor muscles and closing off instead of opening the anus. Due to lack pelvic floor relaxation and coordination the abdominal muscles, one has to work harder to push the feces out.  Many people have never been taught how to defecate efficiently and effectively.  Notice what happens to your body when you are having a bowel movement.  While you are sitting on  the toilet pay attention to the following areas: . Jaw and mouth position . Angle of your hips   . Whether your feet touch the ground or not . Arm placement  . Spine position . Waist . Belly tension . Anus (opening of the anal canal)  An Evacuation/Defecation Plan   Here are the 4 basic points:  1. Lean forward enough for your elbows to rest on your knees 2. Support your feet on the floor or use a low stool if your feet don't touch the  floor  3. Push out your belly as if you have swallowed a beach ball-you should feel a widening of your waist 4. Open and relax your pelvic floor muscles, rather than tightening around the anus      The following conditions my require modifications to your toileting posture:  . If you have had surgery in the past that limits your back, hip, pelvic, knee or ankle flexibility . Constipation   Your healthcare practitioner may make the following additional suggestions and adjustments:  1) Sit on the toilet  a) Make sure your feet are supported. b) Notice your hip angle and spine position-most people find it effective to lean forward or raise their knees, which can help the muscles around the anus to relax  c) When you lean forward, place your forearms on your thighs for support  2) Relax suggestions a) Breath deeply in through your nose and out slowly through your mouth as if you are smelling the flowers and blowing out the candles. b) To become aware of how to relax your muscles, contracting and releasing muscles can be helpful.  Pull your pelvic floor muscles in tightly by using the image of holding back gas, or closing around the anus (visualize making a circle smaller) and lifting the anus up and in.  Then release the muscles and your anus should drop down and feel open. Repeat 5 times ending with the feeling of relaxation. c) Keep your pelvic floor muscles relaxed; let your belly bulge out. d) The digestive tract starts at the mouth and ends at the anal opening, so be sure to relax both ends of the tube.  Place your tongue on the roof of your mouth with your teeth separated.  This helps relax your mouth and will help to relax the anus at the same time.  3) Empty (defecation) a) Keep your pelvic floor and sphincter relaxed, then bulge your anal muscles.  Make the anal opening wide.  b) Stick your belly out as if you have swallowed a beach ball. c) Make your belly wall hard using your belly  muscles while continuing to breathe. Doing this makes it easier to open your anus. d) Breath out and give a grunt (or try using other sounds such as ahhhh, shhhhh, ohhhh or grrrrrrr).  4) Finish a) As you finish your bowel movement, pull the pelvic floor muscles up and in.  This will leave your anus in the proper place rather than remaining pushed out and down. If you leave your anus pushed out and down, it will start to feel as though that is normal and give you incorrect signals about needing to have a bowel movement.  [facility] [place], PT [facility] [address] [place], [place] [address]  Lower abdominal/core stability exercises  1. Practice your breathing technique: Inhale through your nose expanding your belly and rib cage. Try not to breathe into  your chest. Exhale slowly and gradually out your mouth feeling a sense of softness to your body. Practice multiple times. This can be performed unlimited.  2. Finding the lower abdominals. Laying on your back with the knees bent, place your fingers just below your belly button. Using your breathing technique from above, on your exhale gently pull the belly button away from your fingertips without tensing any other muscles. Practice this 5x. Next, as you exhale, draw belly button inwards and hold onto it...then feel as if you are pulling that muscle across your pelvis like you are tightening a belt. This can be hard to do at first so be patient and practice. Do 5-10 reps 1-3 x day. Always recognize quality over quantity; if your abdominal muscles become tired you will notice you may tighten/contract other muscles. This is the time to take a break.   Practice this first laying on your back, then in sitting, progressing to standing and finally adding it to all your daily movements.   3. Finding your pelvic floor. Using the breathing technique above, when your exhale, this time draw  your pelvic floor muscles up as if you were attempting to stop the flow of urination. Be careful NOT to tense any other muscles. This can be hard, BE PATIENT. Try to hold up to 10 seconds repeating 10x. Try 2x a day. Once you feel you are doing this well, add this contraction to exercise #2. First contracting your pelvic floor followed by lower abdominals.   4. Adding leg movements. Add the following leg movements to challenge your ability to keep your core stable:  1. Single leg drop outs: Laying on your back with knees bent feet flat. Inhale,  dropping one knee outward KEEPING YOUR PELVIS STILL. Exhale as you bring the leg back, simultaneously performing your lower abdominal contraction. Do 5-10 on each leg.   2. Marching: While keeping your pelvis still, lift the right foot a few inches, put it down then lift left foot. This will mimic a march. Start slow to establish control. Once you have control you may speed it up. Do 10-20x. You MUST keep your lower abdominlas contracted while you march. Breathe naturally    3. Single leg slides: Inhale while you slowly slide one leg out keeping your pelvis still. Only slide your leg as far as you can keep your pelvis still. Exhale as you bring the leg back to the start, contracting the lower abdominals as you do that. Keep your upper body relaxed. Do 5-10 on each side.     1 time per day.        Quick Contraction: Gravity Resisted (Sitting)    Sitting, quickly squeeze then fully relax pelvic floor. Perform _1_ sets of __5_. Rest for _1__ seconds between sets. Do __2_ times a day.  Copyright  VHI. All rights reserved.  Slow Contraction: Gravity Resisted (Sitting)    Sitting, slowly squeeze pelvic floor for _5__ seconds. Rest for _5__ seconds. Repeat _5__ times. Do _2__ times a day. 2 red lights  Copyright  VHI. All rights reserved.  Prior to sneeze or cough pull up pelvic floor

## 2015-01-21 NOTE — Therapy (Signed)
[facility] [address] [place], [place], [address] Phone: [phone]   Fax:  [phone]  Physical Therapy Treatment  Patient Details  Name: KYNZLI REASE MRN: [id number] Date of Birth: 14-Nov-1965 Referring Provider: Dr. Hale Bogus  Encounter Date: 01/21/2015      PT End of Session - 01/21/15 0850    Visit Number 2   Date for PT Re-Evaluation 04/30/15   PT Start Time 0850   PT Stop Time 0930   PT Time Calculation (min) 40 min   Activity Tolerance Patient tolerated treatment well   Behavior During Therapy [facility] for tasks assessed/performed      Past Medical History  Diagnosis Date  . Headache(784.0)     menstrual migraines  . HPV (human papilloma virus) infection     condyloma  . Abnormal Pap smear of cervix     Past Surgical History  Procedure Laterality Date  . Novasure ablation  7/07  . Hysteroscopy w/d&c  2006  . Rectal polypectomy  2001    anal polyp  . 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 Gynecologic cryosurgery  1984  . Varicose vein surgery    . Skin biopsy      area froze off-left shoulder    There were no vitals filed for this visit.  Visit Diagnosis:  Perineal floor weakness  Pelvic floor dysfunction      Subjective Assessment - 01/21/15 0851    Subjective Last visit helped. I was as good with my kegels.  When I do not drink my tea and coffee. I am 50% better.    Patient Stated Goals strengthen the pelvic muscles, reduce urinary leakage.   Currently in Pain? No/denies                                 PT Education - 01/21/15 0926    Education provided Yes   Education Details abdominal massage, toileting, transverse abdominal , pelvic floor exercise, pelvic floor exercise with coughing and laughing          PT Short Term Goals - 01/21/15 0929    PT SHORT TERM GOAL #1   Title independent with initial HEP for pelvic floor exercises.    Time 4   Period Weeks    Status Achieved   PT SHORT TERM GOAL #2   Title pelvic floor strength 4/5 to reduce urinary leakage decreased >/= 25%   Time 4   Period Weeks   Status On-going   PT SHORT TERM GOAL #3   Title understand on how to not bear down when coughing and laughing   Period Weeks   Status Achieved           PT Long Term Goals - 01/01/15 0160    PT LONG TERM GOAL #1   Title independent with HEP and how to progress    Time 4   Period Months   Status New   PT LONG TERM GOAL #2   Title urniary leakage with laughing, coughing, and sneezing decreased >/= 75%   Time 4   Period Months   Status New   PT LONG TERM GOAL #3   Title able to urinate 1 time and not leak urine after she gets up from the commode   Time 4   Period Months   Status New   PT LONG TERM GOAL #4  Title pelvic floor strength 5/5 so she can return to an exercise program  and not leak urine   Time 4   Period Months   Status New   PT LONG TERM GOAL #5   Title not have to wear a pad due to reduction of urinary leakage   Time 4   Period Months   Status New               Plan - 01/21/15 0930    Clinical Impression Statement Patient is a 49 year old female with diagnosis of stress urinary incontinence.  Patient has learned how to contract pelvic floor with leg movements and breathing.  Patient has learned ways to work with constipation to reduce strain on pelvic floor and reduce urinary leakage.  Patient has met STG #1.  Patient will benefit from physical therapy to reduce urinaryleakage.    Pt will benefit from skilled therapeutic intervention in order to improve on the following deficits Decreased strength;Decreased endurance;Decreased coordination   Rehab Potential Excellent   Clinical Impairments Affecting Rehab Potential None   PT Frequency 1x / week   PT Duration Other (comment)  4 months   PT Treatment/Interventions Electrical Stimulation;Therapeutic exercise;Therapeutic activities;Neuromuscular  re-education;Patient/family education;Manual techniques   PT Next Visit Plan pelvic floor exercises, stretches   PT Home Exercise Plan stretches- hamstring, hip adductors, quads   Consulted and Agree with Plan of Care Patient        Problem List Patient Active Problem List   Diagnosis Date Noted  . SUI (stress urinary incontinence, female) 12/17/2014    GRAY,CHERYL,PT 01/21/2015, 9:33 AM  [facility] [address] [place], [place], [address] Phone: [phone]   Fax:  [phone]  Name: ICEY TELLO MRN: [id number] Date of Birth: 07-Sep-1965

## 2015-01-28 ENCOUNTER — Telehealth: Payer: Self-pay | Admitting: Emergency Medicine

## 2015-01-28 NOTE — Telephone Encounter (Signed)
Call to patient. She states she reviewed her results online and did not call back. Message from Dr. Sabra Heck discussed and patient agreeable. Will follow up as needed. Routing to provider for final review. Patient agreeable to disposition. Will close encounter.

## 2015-01-28 NOTE — Telephone Encounter (Signed)
Encounter opened erroneously.   Closed encounter.   

## 2015-01-31 ENCOUNTER — Encounter: Payer: Self-pay | Admitting: Physical Therapy

## 2015-01-31 ENCOUNTER — Ambulatory Visit: Payer: 59 | Attending: Obstetrics & Gynecology | Admitting: Physical Therapy

## 2015-01-31 DIAGNOSIS — M6289 Other specified disorders of muscle: Secondary | ICD-10-CM

## 2015-01-31 DIAGNOSIS — N907 Vulvar cyst: Secondary | ICD-10-CM | POA: Insufficient documentation

## 2015-01-31 DIAGNOSIS — N8189 Other female genital prolapse: Secondary | ICD-10-CM

## 2015-01-31 DIAGNOSIS — N8184 Pelvic muscle wasting: Secondary | ICD-10-CM | POA: Diagnosis present

## 2015-01-31 NOTE — Patient Instructions (Signed)
Hamstring Step 4    Left leg and foot in stretch. Keep the opposite knee bent to start, then you can progress to slowly lengthen the other leg down as close to floor as possible. Keep lower abdominals tight, belly button away from pants. Stay within tolerance. Hold _20-30__ seconds or breathe 3-4 SLOW breathes.  Relax lengthened leg slightly. This is the 12:00 position. Then put strap in outside hand and let leg drop out to the 2/3:00 positon. Keep pelvis and shoulders level. Finally repeat with leg going outward into 10:00 position.  Repeat press and lengthen __1-2_ times. Do daily.   Lower abdominal/core stability exercises  1. Practice your breathing technique: Inhale through your nose expanding your belly and rib cage. Try not to breathe into your chest. Exhale slowly and gradually out your mouth feeling a sense of softness to your body. Practice multiple times. This can be performed unlimited.  2. Finding the lower abdominals. Laying on your back with the knees bent, place your fingers just below your belly button. Using your breathing technique from above, on your exhale gently pull the belly button away from your fingertips without tensing any other muscles. Practice this 5x. Next, as you exhale, draw belly button inwards and hold onto it...then feel as if you are pulling that muscle across your pelvis like you are tightening a belt. This can be hard to do at first so be patient and practice. Do 5-10 reps 1-3 x day. Always recognize quality over quantity; if your abdominal muscles become tired you will notice you may tighten/contract other muscles. This is the time to take a break.   Practice this first laying on your back, then in sitting, progressing to standing and finally adding it to all your daily movements.   3. Finding your pelvic floor. Using the breathing technique above, when your exhale, this time draw your pelvic floor muscles up as if you were attempting to stop the flow of  urination. Be careful NOT to tense any other muscles. This can be hard, BE PATIENT. Try to hold up to 10 seconds repeating 10x. Try 2x a day. Once you feel you are doing this well, add this contraction to exercise #2. First contracting your pelvic floor followed by lower abdominals.  4. Adding leg movements. Add the following leg movements to challenge your ability to keep your core stable:  1. Single leg drop outs: Laying on your back with knees bent feet flat. Inhale,  dropping one knee outward KEEPING YOUR PELVIS STILL. Exhale as you bring the leg back, simultaneously performing your lower abdominal contraction. Do 5-10 on each leg.  2. Marching: While keeping your pelvis still, lift the right foot a few inches, put it down then lift left foot. This will mimic a march. Start slow to establish control. Once you have control you may speed it up. Do 10-20x. You MUST keep your lower abdominlas contracted while you march. Breathe naturally   3. Single leg slides: Inhale while you slowly slide one leg out keeping your pelvis still. Only slide your leg as far as you can keep your pelvis still. Exhale as you bring the leg back to the start, contracting the lower abdominals as you do that. Keep your upper body relaxed. Do 5-10 on each side.  Lower abdominal/core stability exercises  1. Practice your breathing technique: Inhale through your nose expanding your belly and rib cage. Try not to breathe into your chest. Exhale slowly and gradually out your mouth feeling  a sense of softness to your body. Practice multiple times. This can be performed unlimited.  2. Finding the lower abdominals. Laying on your back with the knees bent, place your fingers just below your belly button. Using your breathing technique from above, on your exhale gently pull the belly button away from your fingertips without tensing any other muscles. Practice this 5x. Next, as you exhale, draw belly button inwards and hold onto it...then  feel as if you are pulling that muscle across your pelvis like you are tightening a belt. This can be hard to do at first so be patient and practice. Do 5-10 reps 1-3 x day. Always recognize quality over quantity; if your abdominal muscles become tired you will notice you may tighten/contract other muscles. This is the time to take a break.   Practice this first laying on your back, then in sitting, progressing to standing and finally adding it to all your daily movements.   3. Finding your pelvic floor. Using the breathing technique above, when your exhale, this time draw your pelvic floor muscles up as if you were attempting to stop the flow of urination. Be careful NOT to tense any other muscles. This can be hard, BE PATIENT. Try to hold up to 10 seconds repeating 10x. Try 2x a day. Once you feel you are doing this well, add this contraction to exercise #2. First contracting your pelvic floor followed by lower abdominals.   4. Adding leg movements. Add the following leg movements to challenge your ability to keep your core stable:  1. Single leg drop outs: Laying on your back with knees bent feet flat. Inhale,  dropping one knee outward KEEPING YOUR PELVIS STILL. Exhale as you bring the leg back, simultaneously performing your lower abdominal contraction. Do 5-10 on each leg.   2. Marching: While keeping your pelvis still, lift the right foot a few inches, put it down then lift left foot. This will mimic a march. Start slow to establish control. Once you have control you may speed it up. Do 10-20x. You MUST keep your lower abdominlas contracted while you march. Breathe naturally    3. Single leg slides: Inhale while you slowly slide one leg out keeping your pelvis still. Only slide your leg as far as you can keep your pelvis still. Exhale as you bring the leg back to the start, contracting the lower abdominals as you do that. Keep your upper body relaxed. Do 5-10 on each side.        KNEE:  Quadriceps - Prone    No need for strap, bring ankle toward buttocks. Press hip into surface. Hold 20-30___ seconds. _3__ reps per set, __1-2_ sets per day.    Copyright  VHI. All rights reserved.   Copyright  VHI. All rights reserved.

## 2015-01-31 NOTE — Therapy (Addendum)
[facility] [address] [place], [place], [address] Phone: [phone]   Fax:  [phone]  Physical Therapy Treatment  Patient Details  Name: Taylor Townsend MRN: [id number] Date of Birth: 04-May-1965 Referring Provider: Dr. Hale Bogus  Encounter Date: 01/31/2015      PT End of Session - 01/31/15 0906    Visit Number 3   Date for PT Re-Evaluation 04/30/15   PT Start Time 0848   PT Stop Time 0926   PT Time Calculation (min) 38 min   Activity Tolerance Patient tolerated treatment well   Behavior During Therapy [facility] for tasks assessed/performed      Past Medical History  Diagnosis Date  . Headache(784.0)     menstrual migraines  . HPV (human papilloma virus) infection     condyloma  . Abnormal Pap smear of cervix     Past Surgical History  Procedure Laterality Date  . Novasure ablation  7/07  . Hysteroscopy w/d&c  2006  . Rectal polypectomy  2001    anal polyp  . 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 Gynecologic cryosurgery  1984  . Varicose vein surgery    . Skin biopsy      area froze off-left shoulder    There were no vitals filed for this visit.  Visit Diagnosis:  Perineal floor weakness  Pelvic floor dysfunction      Subjective Assessment - 01/31/15 0849    Subjective Doing HEP. No complaints this AM.   Currently in Pain? No/denies                                 PT Education - 01/31/15 0853    Education Details HEP advancement: hip stretches, knee stretches and core strength series.    Person(s) Educated Patient   Methods Explanation;Demonstration;Tactile cues;Verbal cues;Handout   Comprehension Verbalized understanding;Returned demonstration          PT Short Term Goals - 01/31/15 0911    PT SHORT TERM GOAL #2   Title pelvic floor strength 4/5 to reduce urinary leakage decreased >/= 25%   Status On-going           PT Long Term Goals - 01/31/15 0911    PT  LONG TERM GOAL #1   Title independent with HEP and how to progress    Time 4   Period Months   Status On-going   PT LONG TERM GOAL #2   Title urniary leakage with laughing, coughing, and sneezing decreased >/= 75%   Time 4   Period Months   Status On-going  50%   PT LONG TERM GOAL #3   Title able to urinate 1 time and not leak urine after she gets up from the commode   Period Months   Status On-going               Plan - 01/31/15 0921    Clinical Impression Statement Pt is doing her HEP, As a result she feels her pelvic control  is 50% improved. We advanced her HEP today to include hip/knee stretches and core strength series.         Problem List Patient Active Problem List   Diagnosis Date Noted  . SUI (stress urinary incontinence, female) 12/17/2014    COCHRAN,JENNIFER, PTA 01/31/2015, 9:29 AM  [facility] [address] [place],  [place], [address] Phone: [phone]   Fax:  [phone]  Name: Taylor Townsend MRN: [id number] Date of Birth: [birth year]  PHYSICAL THERAPY DISCHARGE SUMMARY  Visits from Start of Care: 3  Current functional level related to goals / functional outcomes: See above. Patient has not returned since last visit on 01/31/2016.   Remaining deficits: See above.    Education / Equipment: HEP  Plan:                                                    Patient goals were not met. Patient is being discharged due to not returning since the last visit.  Thank you for the referral. Earlie Counts, PT 04/23/2015 9:14 AM  ?????

## 2015-02-04 ENCOUNTER — Encounter: Payer: 59 | Admitting: Physical Therapy

## 2015-03-10 ENCOUNTER — Other Ambulatory Visit: Payer: Self-pay

## 2015-03-10 DIAGNOSIS — Z1231 Encounter for screening mammogram for malignant neoplasm of breast: Secondary | ICD-10-CM

## 2015-03-10 MED FILL — SUMATRIPTAN SUCC 100 MG TAB: 100 | 30 days supply | Qty: 9 | Fill #0

## 2015-03-26 ENCOUNTER — Ambulatory Visit: Payer: 59

## 2015-04-03 ENCOUNTER — Ambulatory Visit
Admission: RE | Admit: 2015-04-03 | Discharge: 2015-04-03 | Disposition: A | Discharge status: Home / Self Care | Payer: 59 | Source: Ambulatory Visit

## 2015-04-03 DIAGNOSIS — Z1231 Encounter for screening mammogram for malignant neoplasm of breast: Secondary | ICD-10-CM

## 2015-04-11 ENCOUNTER — Ambulatory Visit: Payer: 59 | Admitting: Obstetrics & Gynecology

## 2015-04-28 DIAGNOSIS — G43909 Migraine, unspecified, not intractable, without status migrainosus: Secondary | ICD-10-CM | POA: Diagnosis not present

## 2015-04-28 DIAGNOSIS — J309 Allergic rhinitis, unspecified: Secondary | ICD-10-CM | POA: Diagnosis not present

## 2015-04-28 DIAGNOSIS — Z0000 Encounter for general adult medical examination without abnormal findings: Secondary | ICD-10-CM | POA: Diagnosis not present

## 2015-04-28 DIAGNOSIS — K5900 Constipation, unspecified: Secondary | ICD-10-CM | POA: Diagnosis not present

## 2015-04-28 DIAGNOSIS — R413 Other amnesia: Secondary | ICD-10-CM | POA: Diagnosis not present

## 2015-05-16 DIAGNOSIS — G9389 Other specified disorders of brain: Secondary | ICD-10-CM | POA: Diagnosis not present

## 2015-07-14 ENCOUNTER — Ambulatory Visit (INDEPENDENT_AMBULATORY_CARE_PROVIDER_SITE_OTHER): Payer: 59 | Admitting: Neurology

## 2015-07-14 ENCOUNTER — Encounter: Payer: Self-pay | Admitting: Neurology

## 2015-07-14 VITALS — BP 101/71 | HR 62 | Ht 69.5 in | Wt 153.0 lb

## 2015-07-14 DIAGNOSIS — R413 Other amnesia: Secondary | ICD-10-CM

## 2015-07-14 DIAGNOSIS — G43009 Migraine without aura, not intractable, without status migrainosus: Secondary | ICD-10-CM | POA: Diagnosis not present

## 2015-07-14 DIAGNOSIS — R4189 Other symptoms and signs involving cognitive functions and awareness: Secondary | ICD-10-CM | POA: Diagnosis not present

## 2015-07-14 DIAGNOSIS — G43909 Migraine, unspecified, not intractable, without status migrainosus: Secondary | ICD-10-CM | POA: Insufficient documentation

## 2015-07-14 NOTE — Patient Instructions (Addendum)
Remember to drink plenty of fluid, eat healthy meals and do not skip any meals. Try to eat protein with a every meal and eat a healthy snack such as fruit or nuts in between meals. Try to keep a regular sleep-wake schedule and try to exercise daily, particularly in the form of walking, 20-30 minutes a day, if you can.   As far as diagnostic testing: Dr. Valentina Shaggy  Our phone number is [phone]. We also have an after hours call service for urgent matters and there is a physician on-call for urgent questions. For any emergencies you know to call 911 or go to the nearest emergency room

## 2015-07-14 NOTE — Progress Notes (Addendum)
[facility]    Provider:  Dr Jaynee Eagles Referring Provider: Kathyrn Lass, MD Primary Care Physician:  Tawanna Solo, MD  CC:  Memory changes  HPI:  Taylor Townsend is a 50 y.o. female here as a referral from Dr. Sabra Heck for memory changes. 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 She is young, healthy and very high functioning. She is here with her husband who provides information as well. She used to have a great memory for phone numbers and names, that has disappeared, she has to meet someone 4-5 times now before remembering names. She has difficulty with words, she can't remember diagnoses. She used be able to remember a chart and now finds it difficult to look at details and remember them all. She is making a lot of mistakes at work, people have noticed. She was driving home from [facility] and she realized that she had no idea where she was. She eventually figured it out and got back home but that scared her. 2-3 months ago, the cat was vomiting and she told her husband she was going to the Vet but she had already been there. She forgot she already took the cat to the vet. She doesn't remember the person she saw, it was just a few weeks prior. She forgot her address, she had to walk outside and look at the mailbox. Husband is here and he says he doesn't notice it as much as she does but he does notice problems with her memory, she forgets conversations. Husband says she forgets things they discuss. Memory has been worsening slowly for years, started bothering her a lot about 6 months ago when she felt she had to change jobs. Grandmother with alzheimer's in her 58s and died at 82. She denies stress, no depression, no mood disorders. No significant medical disorders. She has migraines occasionally.  Reviewed notes, labs and imaging from outside physicians, which showed:  Personally  reviewed [place] the brain March 2017 (on CD). Normal for age. Some very mild chronic white matter changes that may be related to microvascular ischemia or migraines.   Reviewed labs from pcp: B12 461 and TSH recently normal. CBC/CMP unremarkable.   Review of Systems: Patient complains of symptoms per HPI as well as the following symptoms:swelling in legs . Pertinent negatives per HPI. All others negative.   Social History   Social History  . Marital Status: Divorced    Spouse Name: N/A  . Number of Children: 1  . Years of Education: 20+   Occupational History  . Pa- [employer]    Social History Main Topics  . Smoking status: Never Smoker   . Smokeless tobacco: Never Used  . Alcohol Use: 0.0 oz/week    0 Standard drinks or equivalent per week     Comment: rarely 4 x year  . Drug Use: No  . Sexual Activity:    Partners: Male    Birth Control/ Protection: Other-see comments     Comment: vasectomy   Other Topics Concern  . Not on file   Social History Narrative   Lives w/ husband   Caffeine use: Drinks 4-5 cups per day    Family History  Problem Relation Age of Onset  . Cancer Maternal Grandmother     endometrial  . Breast cancer Mother   . Pancreatic cancer Maternal Grandfather   . Hypertension Mother   .  Hypertension Father   . Hypertension Other     all grandparents  . Coronary artery disease Mother   . Coronary artery disease Father   . Bipolar disorder Mother   . Bipolar disorder Brother   . Osteoporosis Mother     Past Medical History  Diagnosis Date  . Headache(784.0)     menstrual migraines  . HPV (human papilloma virus) infection     condyloma  . Abnormal Pap smear of cervix     Past Surgical History  Procedure Laterality Date  . Novasure ablation  7/07  . Hysteroscopy w/d&c  2006  . Rectal polypectomy  2001    anal polyp  . 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 Gynecologic cryosurgery  1984  . Varicose vein surgery    . Skin biopsy      area froze  off-left shoulder    Current Outpatient Prescriptions  Medication Sig Dispense Refill  . cetirizine (ZYRTEC) 10 MG tablet Take 10 mg by mouth daily. As needed    . fluticasone (FLONASE) 50 MCG/ACT nasal spray Place 2 sprays into the nose daily.    . naproxen (NAPROSYN) 500 MG tablet Take with imitrex for migraines 30 tablet 0  . PATADAY 0.2 % SOLN   4  . polyethylene glycol (MIRALAX / GLYCOLAX) packet Take 17 g by mouth daily.    . SUMAtriptan (IMITREX) 100 MG tablet Take 1 tablet (100 mg total) by mouth every 2 (two) hours as needed for migraine. Max dosage 200mg  in 24 hrs 9 tablet 13   No current facility-administered medications for this visit.    Allergies as of 07/14/2015  . (No Known Allergies)    Vitals: BP 101/71 mmHg  Pulse 62  Ht 5' 9.5" (1.765 m)  Wt 153 lb (69.4 kg)  BMI 22.28 kg/m2 Last Weight:  Wt Readings from Last 1 Encounters:  07/14/15 153 lb (69.4 kg)   Last Height:   Ht Readings from Last 1 Encounters:  07/14/15 5' 9.5" (1.765 m)   Physical exam: Exam: Gen: NAD, conversant, well nourised, obese, well groomed                     CV: RRR, no MRG. No Carotid Bruits. No peripheral edema, warm, nontender Eyes: Conjunctivae clear without exudates or hemorrhage  Neuro: Detailed Neurologic Exam  Speech:    Speech is normal; fluent and spontaneous with normal comprehension.  Cognition: MMSE - Mini Mental State Exam 07/14/2015  Orientation to time 5  Orientation to Place 5  Registration 3  Attention/ Calculation 5  Recall 3  Language- name 2 objects 2  Language- repeat 1  Language- follow 3 step command 3  Language- read & follow direction 1  Write a sentence 1  Copy design 1  Total score 30      The patient is oriented to person, place, and time;     recent and remote memory intact;     language fluent;     normal attention, concentration,     fund of knowledge Cranial Nerves:    The pupils are equal, round, and reactive to light. The fundi  are normal and spontaneous venous pulsations are present. Visual fields are full to finger confrontation. Extraocular movements are intact. Trigeminal sensation is intact and the muscles of mastication are normal. The face is symmetric. The palate elevates in the midline. Hearing intact. Voice is normal. Shoulder shrug is normal. The tongue has normal motion  without fasciculations.   Coordination:    Normal finger to nose and heel to shin. Normal rapid alternating movements.   Gait:    Heel-toe and tandem gait are normal.   Motor Observation:    No asymmetry, no atrophy, and no involuntary movements noted. Tone:    Normal muscle tone.    Posture:    Posture is normal. normal erect    Strength:    Strength is V/V in the upper and lower limbs.      Sensation: intact to LT     Reflex Exam:  DTR's:    Deep tendon reflexes in the upper and lower extremities are normal bilaterally.   Toes:    The toes are downgoing bilaterally.   Clonus:    Clonus is absent.    Assessment/Plan:  50 year old with migraines, no mood disorder, c/o cognitive issues. 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 Jamal Maes  Sarina Ill, MD  [facility] [address] [place], [place] [address]  Phone [phone] Fax [phone]

## 2015-07-22 MED FILL — FLUTICASONE PROP 50 MCG SPR: 50 | 90 days supply | Qty: 48 | Fill #0

## 2015-07-22 MED FILL — POLYETHYLENE GLYCOL 3350 PO: 90 days supply | Qty: 1581 | Fill #0

## 2015-09-19 DIAGNOSIS — H524 Presbyopia: Secondary | ICD-10-CM | POA: Diagnosis not present

## 2015-09-29 ENCOUNTER — Ambulatory Visit: Payer: 59 | Attending: Psychology | Admitting: Psychology

## 2015-09-29 DIAGNOSIS — R413 Other amnesia: Secondary | ICD-10-CM | POA: Insufficient documentation

## 2015-09-29 NOTE — Progress Notes (Signed)
[facility]  [address]   Telephone [phone] [address] Fax [phone] [place], [place] [address]  Initial Contact Note  Name:  Taylor Townsend Date of Birth; August 16, 1965 MRN:  [id number] Date:  09/29/2015  [name] is an 50 y.o. female who was referred for neuropsychological evaluation by Sarina Ill, MD  due to problems with concentration and memory.   A total of 5 hours was spent today reviewing medical records, interviewing (CPT [phone]) Taylor Townsend and administering and scoring neurocognitive tests (CPT [phone] & [phone]).  Preliminary Diagnostic Impression: Complaints of memory disturbance [R41.3]  There were no concerns expressed or behaviors displayed by Taylor Townsend that would require immediate attention.   A full report will follow once the planned testing has been completed. Her next appointment is scheduled for 10/10/15.  Jamey Ripa, Ph.D Licensed Psychologist 09/29/2015

## 2015-09-30 ENCOUNTER — Encounter: Payer: Self-pay | Admitting: Psychology

## 2015-10-06 MED FILL — SUMATRIPTAN SUCC 100 MG TAB: 100 | 30 days supply | Qty: 9 | Fill #1

## 2015-10-10 ENCOUNTER — Ambulatory Visit (INDEPENDENT_AMBULATORY_CARE_PROVIDER_SITE_OTHER): Payer: 59 | Admitting: Psychology

## 2015-10-10 ENCOUNTER — Encounter: Payer: Self-pay | Admitting: Psychology

## 2015-10-10 DIAGNOSIS — R413 Other amnesia: Secondary | ICD-10-CM

## 2015-10-10 MED FILL — POLYETHYLENE GLYCOL 3350 PO: 90 days supply | Qty: 1581 | Fill #1

## 2015-10-10 NOTE — Progress Notes (Signed)
[facility]  [address]   Telephone [phone] [address] Fax [phone] [place], [place] [address]   [place]* This report should not be released without the consent of the client  Name:   Sumayo Foglia. York    Date of Birth:  2066-02-16 [facility] MR#:  [id number] Dates of Evaluation: 09/29/15 & 10/10/15  Reason for Referral Airalynn Saxena is a 50 year-old right-handed woman who was referred for neuropsychological evaluation by Sarina Ill, MD of [facility]. Ms. Allean Found has complained of an approximate two year history of problems with mental acuity, concentration and memory. Recent lab studies were normal. Dr. Jaynee Eagles noted that an MRI scan of the brain from March 2017 was normal for her age and showed only very mild chronic white matter changes possibly related to microvascular ischemia or migraines.    Sources of Information Electronic medical records from the [place] were reviewed. Ms. Allean Found was interviewed.   Chief Complaints Ms. York has noticed having had intermittent problems with concentration and memory over the past two years that have become gradually more frequent. She has uncharacteristically struggled to remember phone numbers, names and words. She has noticed that while reading her mind tends to wander to other tasks planned that day. 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  Nine months ago she got lost while driving to a familiar location. About six months ago, she did not recall having taken her cat to the [place] two weeks before. Two months ago she could not recall her home address when asked for it over the phone and had to go out to the mailbox to check it. On the Everyday Memory Questionnaire, she indicated that at least once per day over the past three months she has  forgotten where she put something, been unable to find a word while talking and has had to re-check whether she had done a task as planned.   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 While she believes that she has been less efficient at her job, she has not received any negative performance feedback. She stated her belief that her reasoning and judgment abilities have remained sound. She did not report any problems performing her basic or instrumental activities of daily living.   She did not report any coincident changes in her health, medication usage, mood, habits, level of life stress (if anything a bit lower over the past two years) or social situation. She stated that her cognitive functioning has seemed better though not back to baseline since she began taking Vitamin D3 supplements in May 2017.  She described herself to be in excellent health. She did complain of frequent mild headaches with only occasional migraines, brief instances of emotional lability that she attributed to perimenopause, mild arthritic pain in her thumbs, neck, knee and left shoulder, mild left ear hearing loss and increased frequency of awakening from sleep over the past year. She denied problems with eye-hand coordination, balance, limb strength, gait, daytime alertness, vision, swallowing, speech, smell, taste or appetite. She described herself as contented in life and without problems related to mood, stress or psychosocial adjustment.   Background She lives with her boyfriend of 2  years. She stated that they plan to  marry. She was previously divorced in 1996. She has a 24 year old daughter.  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 Prior to training as a [employer], she had worked as a Secondary school teacher for [employer].     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 She reported no history of attentional or learning difficulties.   She has no significant health problems other than occasional migraine headaches presumed related to her menstrual cycle and a one year history of urinary stress incontinence. Her medications include cetirizine, fluticasone nasal spray and sumatriptan, all on an as needed basis.   She reported no history of head injury, loss of consciousness, seizure activity, stroke-like symptoms or exposure to toxic chemicals.   Her family medical history was notable for her mother having been diagnosed with Bipolar disorder or Schizophrenia. A grandmother showed signs of Alzheimer's disease in her early 10 and died at the age of 65.   She reported no use of alcohol, illicit drugs or tobacco products.  She reported no history of serious emotional difficulties. About five years ago, she was prescribed Lexapro for stress but stooped using it after two weeks due to having a syncopal episode. She has consulted with psychological counselors off and on in the past, usually about relationship issues.  Observations She appeared as an appropriately dressed and groomed woman in no apparent distress. She interacted in a consistently pleasant and cooperative manner. She spoke in a normal tone of voice, maintained good eye contact and responded to all questions. She seemed to be in good spirits. Her affect appeared within a wide range. Her thought processes were coherent and organized without loose associations, verbal perseverations or flight of ideas. She was deemed to be a reliable informant. Her thought content was devoid of unusual or bizarre ideas.  Evaluation Procedures In addition to a review of medical records as well as clinical interviews, the following tests or questionnaires were administered: Animal Naming  Test  Beck Anxiety Inventory Beck Depression Inventory-II Boston Naming Test Conners Continuous Performance Test-II  Controlled Oral Word Association Test Everyday Memory Questionnaire  Modified Wisconsin Card Sorting Test Rey Complex Figure: Copy Stroop Color & Word Test Trail Making A & B  Wechsler Adult Intelligence Scale-IV: Music therapist, Coding, Digit Span & Similarities Wechsler Memory Scale-IV Wide Range Achievement Test-4: Word Reading  Assessment Results Test Validity & Interpretive Considerations It was concluded that the test results represented a valid measure of her current cognitive functioning. She did not display signs of physical distress. She did not report or display problems with vision (she wore her eyeglasses), hearing or motor control. She appeared consistently alert and persisted to task. There were no signs of careless, impulsive or perseverative responding. She appeared to expend maximal effort.   Her pre-morbid intellectual potential was estimated to fall within the High Average range based on her educational background (i.e., Master's degree) coupled with a measure of word reading (Wide Range Achievement Test-4) that is usually resistant to the effects of neurological and psychiatric disorder.  Her test scores were corrected to reflect norms for her age and, whenever possible, her gender and educational level (i.e., 18 years). A listing of test results can be found at the end of this report.  Speed of Processing& Attention Her performances on tests of visual processing speed that required transcribing symbols to match digits using a key (  Wechsler Adult Intelligence Scale-IV (WAIS-IV) Coding) or drawing lines to connect randomly arrayed numbers in sequence (Trails A) fell within the Average range. Her speed to read words out loud (Stroop Color & Word Test) fell within the Low Average range while her speed to name color hues fell within the Average range.   Her  performance on a test of sustained visual attention that required detecting targets flashed on a screen (Conners Continuous Performance Test-II) was overall satisfactory despite two significant scale elevations. She responded to an expected number of targets and successfully inhibited her responses to non-targets. In contrast, her overall reaction time was very slow and became even more so when the stimuli were displayed at a slower tempo. This finding sometimes suggests lowered activation or arousal.   Her passive auditory attention span, as measured by her repetition of digit sequences (WAIS-IV Digit Span Forward), was within the Very Superior range. Her attentional capacity to hold and manipulate information within working memory was within normal expectations as she scored between the Average to Superior ranges on tests that required mental rearranging of digit sequences in reverse or in ascending numerical order (WAIS-IV Digit Span Backward & Sequencing), immediately recognizing symbols in left to right order (Wechsler Memory Scale-IV (WMS-IV) Symbol Span) or immediately recalling spatial locations within a grid (WMS-IV Spatial Span).   Learning & Memory Her ability to immediately recall auditory and visual information (WMS-IV Immediate Memory Index) fell within the Average range. Her immediate auditory subtest scores ranged from the 75th to 84th percentiles while her immediate visual memory subtest scores were somewhat lower as they varied from the 25th to 50th percentiles.  A measure of her ability to recall auditory and visual information after a delay of approximately twenty minutes (WMS-IV Delayed Memory Index) fell within the Average range. All of her delayed memory subtest scores were as expected given her immediate memory subtest scores, which indicated normal retention over the delay interval. Her delayed memory recognition scores were commensurate with her free recall performances. She  demonstrated perfect recognition on two subtests.  Language Her performance on a measure of confrontational naming [facility] Naming Test) was within the Low Average range. Her phonemic fluency, as measured by her ability to generate words to designated letters (Controlled Oral Word Association Test), fell within the Average range. A measure of semantic fluency (Animal Naming Test) fell within the High Average range. Her ability to read words (Wide Range Achievement Test-4: Word Reading) fell at the upper boundary of the Average range.   Visual-Spatial Perception & Organization There were no signs of spatial inattention or problems with visual recognition. Her score on a test of visuospatial organization that required assembly of two-dimensional block designs from models (WAIS-IV Block Design) fell at the upper boundary of the Average range. Her copy of a spatially complex geometric figure (Rey Complex Figure) was error free.       Executive Function She performed within normal expectations on measures of executive function that assessed working memory capacity (WAIS-IV Digit Span; WMS-IV Symbol Span & Spatial Span), visual sequencing coupled with mental set shifting (Trails B), response inhibition (Conners Continuous Performance Test-II ), cognitive flexibility (Stroop Test Color-Word trial),verbal fluency (Controlled Oral Word Association Test; Animal Naming Test), abstract verbal reasoning (WAIS-IV Similarities) and nonverbal problem solving dependent on inferential reasoning [employer]).  Emotional Status On the Beck Depression Inventory-II, her score of 6 fell within the normal or minimal range. No symptom was endorsed beyond a 1 on a  0 - 3 scale. Of note, she did not endorse having had any suicidal thoughts within the past two weeks. On the [facility] her score of 6 was likewise within the minimal range.   Summary & Conclusions Stacie Cabot is a 50 year old  woman who reported having experienced problems with concentration and memory over the past two years. She did not report any coincident changes in her health, sensory skills, medication usage, mood, level of life stress or social situation. She stated that her cognitive functioning seemed to improve after she began taking Vitamin D3 supplements in May 2017 though not back to baseline. She continues to work fulltime and perform her instrumental activities of daily living.   Neuropsychological testing did not identify a pattern suggestive of cognitive dysfunction. Her neuropsychological profile was predominantly within normal expectations. She did show a lower than expected performance on a measure of confrontational naming and slowed processing speed on a sustained visual attention task though it was not clear that these results were clinically significant versus signs of normal variability. With regards to her psychological functioning, she described herself as a contented person without problems related to mood or psychosocial adjustment.   At this point, it is not clear what might be causing her subjective cognitive difficulties. To speculate, one possibility is that her cognitive functioning is being disrupted by hormonal fluctuations associated with perimenopause.  Diagnostic Impression Complaint of memory disturbance [R41.3]   I have appreciated the opportunity to evaluate Ms. York. The results from this evaluation were discussed with her and her significant other, Mr. Quiana Burchfield, on 10/10/15. Please feel free to contact me with any comments or questions.     ______________________ Jamey Ripa, Ph.D Licensed Psychologist       Copies to: Sarina Ill, MD   [facility]      Ms. Imogene Burn       ADDENDUM-NEUROPSYCHOLOGICAL TEST RESULTS  Animal Naming Test Score= 31 91st (adjusted for age, gender and educational level)   Boston Naming  Test Score=  56/60 16th  (adjusted for age, gender and educational level)   Conners' Continuous Performance Test II  Measure Guideline  Omissions Within average range   Commissions Good performance   Hit Reaction Time Atypically slow     Detectability  (d') Good performance     Perseverations Within average range     Hit Reaction Time Block Change Good performance  Hit Standard Error Block Change Very good performance     Hit Reaction Time ISI Change Mildly atypical  Hit Standard Error ISI Change Mildly atypical   Controlled Oral Word Association Test Score=  49 words/0 repetitions 47th  (adjusted for age, gender and educational level)   Modified Wisconsin Card Sorting Test  Categories correct   6    54th (adjusted for age and education)  Perseverative errors  0       76th              Total errors   4      42nd            Executive function composite 107     68th            Rey Complex Figure: copy  Score= 36/36 normal    Stroop Color & Word Test  Score Residual % (adjusted for age and educational level)  Word   100 -14 21st           Color  83  3 55th         Color-Word    40 -5 32nd           Trails A Score=  26s  0e    37th (adjusted for age, gender and educational level)  Trails B Score= 47s 0e      70th (adjusted for age, gender and educational level)   Wechsler Adult Intelligence Scale-IV   Subtest Scaled Score Percentile  Block Design    12    75th      Similarities    15    95th        Digit Span   Forward   Backward   Sequencing    18    18    16    13   >99th       >99th        98th          84th          Coding      10    50th        Wechsler Memory Scale-IV Index Index Score Percentile  Immediate Memory  105 63rd          Auditory Memory  115 84th         Visual Memory    96 39th          Delayed Memory  18 70th          Visual Working Memory  109  73rd         Wide Range Achievement Test-4 Subtest  Raw score Standard score Percentile   Word Reading 66/70 110 75th

## 2015-10-15 DIAGNOSIS — M7582 Other shoulder lesions, left shoulder: Secondary | ICD-10-CM | POA: Diagnosis not present

## 2015-10-15 DIAGNOSIS — M542 Cervicalgia: Secondary | ICD-10-CM | POA: Diagnosis not present

## 2015-12-15 DIAGNOSIS — D235 Other benign neoplasm of skin of trunk: Secondary | ICD-10-CM | POA: Diagnosis not present

## 2015-12-15 DIAGNOSIS — L814 Other melanin hyperpigmentation: Secondary | ICD-10-CM | POA: Diagnosis not present

## 2015-12-15 DIAGNOSIS — D225 Melanocytic nevi of trunk: Secondary | ICD-10-CM | POA: Diagnosis not present

## 2015-12-15 DIAGNOSIS — L739 Follicular disorder, unspecified: Secondary | ICD-10-CM | POA: Diagnosis not present

## 2015-12-15 DIAGNOSIS — L820 Inflamed seborrheic keratosis: Secondary | ICD-10-CM | POA: Diagnosis not present

## 2015-12-15 DIAGNOSIS — L821 Other seborrheic keratosis: Secondary | ICD-10-CM | POA: Diagnosis not present

## 2015-12-15 DIAGNOSIS — D1801 Hemangioma of skin and subcutaneous tissue: Secondary | ICD-10-CM | POA: Diagnosis not present

## 2015-12-17 DIAGNOSIS — M7502 Adhesive capsulitis of left shoulder: Secondary | ICD-10-CM | POA: Diagnosis not present

## 2015-12-31 DIAGNOSIS — M7502 Adhesive capsulitis of left shoulder: Secondary | ICD-10-CM | POA: Diagnosis not present

## 2016-01-12 DIAGNOSIS — M7502 Adhesive capsulitis of left shoulder: Secondary | ICD-10-CM | POA: Diagnosis not present

## 2016-01-30 DIAGNOSIS — K5900 Constipation, unspecified: Secondary | ICD-10-CM | POA: Diagnosis not present

## 2016-03-08 ENCOUNTER — Other Ambulatory Visit: Payer: Self-pay | Admitting: Obstetrics & Gynecology

## 2016-03-08 MED FILL — POLYETHYLENE GLYCOL 3350 PO: 90 days supply | Qty: 1581 | Fill #0

## 2016-03-08 MED FILL — SUMATRIPTAN SUCC 100 MG TAB: 100 | 30 days supply | Qty: 18 | Fill #0

## 2016-03-08 NOTE — Telephone Encounter (Signed)
Medication refill request: Imitrex  Last AEX:  12-17-14  Next AEX: 04-08-16  Last MMG (if hormonal medication request): 04-03-15 WNL  Refill authorized: please advise

## 2016-04-08 ENCOUNTER — Ambulatory Visit (INDEPENDENT_AMBULATORY_CARE_PROVIDER_SITE_OTHER): Payer: 59 | Admitting: Obstetrics & Gynecology

## 2016-04-08 ENCOUNTER — Encounter: Payer: Self-pay | Admitting: Obstetrics & Gynecology

## 2016-04-08 VITALS — BP 98/64 | HR 62 | Resp 14 | Ht 69.0 in | Wt 148.6 lb

## 2016-04-08 DIAGNOSIS — Z124 Encounter for screening for malignant neoplasm of cervix: Secondary | ICD-10-CM

## 2016-04-08 DIAGNOSIS — Z1151 Encounter for screening for human papillomavirus (HPV): Secondary | ICD-10-CM | POA: Diagnosis not present

## 2016-04-08 DIAGNOSIS — R3989 Other symptoms and signs involving the genitourinary system: Secondary | ICD-10-CM

## 2016-04-08 DIAGNOSIS — Z789 Other specified health status: Secondary | ICD-10-CM

## 2016-04-08 DIAGNOSIS — N3942 Incontinence without sensory awareness: Secondary | ICD-10-CM | POA: Diagnosis not present

## 2016-04-08 DIAGNOSIS — Z01419 Encounter for gynecological examination (general) (routine) without abnormal findings: Secondary | ICD-10-CM

## 2016-04-08 MED ORDER — NORETHINDRONE 0.35 MG PO TABS: 1.0000 | ORAL_TABLET | Freq: Every day | ORAL | 3 refills | Status: DC

## 2016-04-08 MED ORDER — RIZATRIPTAN BENZOATE 10 MG PO TABS: 10.0000 mg | ORAL_TABLET | ORAL | 0 refills | Status: DC | PRN

## 2016-04-08 MED FILL — NORETHINDRONE 0.35 MG TAB: 0.35 | 84 days supply | Qty: 84 | Fill #0

## 2016-04-08 MED FILL — RIZATRIPTAN 10 MG TABLET: 10 | 30 days supply | Qty: 9 | Fill #0

## 2016-04-08 NOTE — Progress Notes (Signed)
51 y.o. G1P1001 DivorcedCaucasianF here for annual exam.  Had some issues with   Having very irregular cycles at this point.  At times bleeding is like a regular cycle.    Did go to PT last year.  Felt that helped and was pleased with this.  She is still double voiding and has incontinence (at times) when she stands up after voiding.  She does feel that PT helped but not enough.  Getting married in may.  Very happy about this.  Patient's last menstrual period was 03/15/2016.          Sexually active: Yes.    The current method of family planning is vasectomy.    Exercising: No.  The patient does not participate in regular exercise at present. Smoker:  no  Health Maintenance: Pap:  12/17/14 negative, 05/21/13 negative  History of abnormal Pap:  yes MMG:  04/03/15 BIRADS 1 negative  Colonoscopy:  08/05/14- repeat 5 years  BMD:   2001 per patient TDaP:  05/21/13  Pneumonia vaccine(s):  never Zostavax:   never Hep C testing: 05/15/12 negative  Screening Labs: PCP, Hb today: PCP, Urine today: PCP   reports that she has never smoked. She has never used smokeless tobacco. She reports that she drinks alcohol. She reports that she does not use drugs.  Past Medical History:  Diagnosis Date  . Abnormal Pap smear of cervix   . Headache(784.0)    menstrual migraines  . HPV (human papilloma virus) infection    condyloma    Past Surgical History:  Procedure Laterality Date  . GYNECOLOGIC CRYOSURGERY  1984  . HYSTEROSCOPY W/D&C  2006  . LEEP  1996   [facility] [place]  7/07  . RECTAL POLYPECTOMY  2001   anal polyp  . SKIN BIOPSY     area froze off-left shoulder  . VARICOSE VEIN SURGERY      Current Outpatient Prescriptions  Medication Sig Dispense Refill  . cetirizine (ZYRTEC) 10 MG tablet Take 10 mg by mouth daily. As needed    . fluticasone (FLONASE) 50 MCG/ACT nasal spray Place 2 sprays into the nose as needed.     . naproxen (NAPROSYN) 500 MG tablet Take with imitrex for  migraines 30 tablet 0  . PATADAY 0.2 % SOLN as needed.   4  . polyethylene glycol (MIRALAX / GLYCOLAX) packet Take 17 g by mouth daily.    . SUMAtriptan (IMITREX) 100 MG tablet TAKE 1 TABLET BY MOUTH EVERY 2 HOURS AS NEEDED FOR MIGRAINE. MAX DOSAGE 200MG  IN 24 HRS 9 tablet 13   No current facility-administered medications for this visit.     Family History  Problem Relation Age of Onset  . Cancer Maternal Grandmother     endometrial  . Breast cancer Mother   . Hypertension Mother   . Coronary artery disease Mother   . Bipolar disorder Mother   . Osteoporosis Mother   . Pancreatic cancer Maternal Grandfather   . Hypertension Father   . Coronary artery disease Father   . Melanoma Father   . Hypertension Other     all grandparents  . Bipolar disorder Brother     ROS:  Pertinent items are noted in HPI.  Otherwise, a comprehensive ROS was negative.  Exam:   BP 98/64 (BP Location: Right Arm, Patient Position: Sitting, Cuff Size: Normal)   Pulse 62   Resp 14   Ht 5\' 9"  (1.753 m)   Wt 148 lb 9.6 oz (  67.4 kg)   LMP 03/15/2016   BMI 21.94 kg/m    Height: 5\' 9"  (175.3 cm)  Ht Readings from Last 3 Encounters:  04/08/16 5\' 9"  (1.753 m)  07/14/15 5' 9.5" (1.765 m)  12/17/14 5' 9.25" (1.759 m)    General appearance: alert, cooperative and appears stated age Head: Normocephalic, without obvious abnormality, atraumatic Neck: no adenopathy, supple, symmetrical, trachea midline and thyroid normal to inspection and palpation Lungs: clear to auscultation bilaterally Breasts: normal appearance, no masses or tenderness Heart: regular rate and rhythm Abdomen: soft, non-tender; bowel sounds normal; no masses,  no organomegaly Extremities: extremities normal, atraumatic, no cyanosis or edema Skin: Skin color, texture, turgor normal. No rashes or lesions Lymph nodes: Cervical, supraclavicular, and axillary nodes normal. No abnormal inguinal nodes palpated Neurologic: Grossly  normal   Pelvic: External genitalia:  no lesions              Urethra:  normal appearing urethra with no masses, tenderness or lesions              Bartholins and Skenes: normal                 Vagina: normal appearing vagina with normal color and discharge, no lesions              Cervix: no lesions              Pap taken: Yes.   Bimanual Exam:  Uterus:  normal size, contour, position, consistency, mobility, non-tender              Adnexa: normal adnexa and no mass, fullness, tenderness               Rectovaginal: Confirms               Anus:  normal sphincter tone, no lesions  Chaperone was present for exam.  A:  Well Woman with normal exam Perimenopausal H/O abnormal pap with cry and LEEP 1996 Increased SUI Family hx of breast cancer of mother Family endometrial cancer in MGM  P:   Mammogram yearly.  Guidelines reviewed. pap smear with HR HPV obtained today Return for urodynamics Lab work and vaccines UTD Imitrex return annually or prn

## 2016-04-08 NOTE — Progress Notes (Signed)
Following appointment with Dr Sabra Heck, consult scheduled with Dr Quincy Simmonds for Monday 04-19-16 at 3:30 pm. Patient declined earlier appointment.

## 2016-04-12 LAB — IPS PAP TEST WITH HPV

## 2016-04-15 ENCOUNTER — Telehealth: Payer: Self-pay | Admitting: Obstetrics and Gynecology

## 2016-04-15 NOTE — Telephone Encounter (Signed)
Patient canceled her consult appointment 04/19/16. Patient said "I do not want to reschedule at this time, I will call later to reschedule if needed'.

## 2016-04-15 NOTE — Telephone Encounter (Signed)
Message forwarded to Dr. Sabra Heck.  Cc- Dr. Sabra Heck

## 2016-04-19 ENCOUNTER — Ambulatory Visit: Payer: 59 | Admitting: Obstetrics and Gynecology

## 2016-05-21 ENCOUNTER — Other Ambulatory Visit: Payer: Self-pay | Admitting: Family Medicine

## 2016-05-21 DIAGNOSIS — Z1231 Encounter for screening mammogram for malignant neoplasm of breast: Secondary | ICD-10-CM

## 2016-06-07 DIAGNOSIS — G43909 Migraine, unspecified, not intractable, without status migrainosus: Secondary | ICD-10-CM | POA: Diagnosis not present

## 2016-06-07 DIAGNOSIS — Z1159 Encounter for screening for other viral diseases: Secondary | ICD-10-CM | POA: Diagnosis not present

## 2016-06-07 DIAGNOSIS — Z0000 Encounter for general adult medical examination without abnormal findings: Secondary | ICD-10-CM | POA: Diagnosis not present

## 2016-06-07 DIAGNOSIS — Z23 Encounter for immunization: Secondary | ICD-10-CM | POA: Diagnosis not present

## 2016-06-07 DIAGNOSIS — K5900 Constipation, unspecified: Secondary | ICD-10-CM | POA: Diagnosis not present

## 2016-06-07 DIAGNOSIS — J309 Allergic rhinitis, unspecified: Secondary | ICD-10-CM | POA: Diagnosis not present

## 2016-06-15 MED FILL — FLUTICASONE PROP 50 MCG SPR: 50 | 90 days supply | Qty: 48 | Fill #1

## 2016-06-15 MED FILL — POLYETHYLENE GLYCOL 3350 PO: 90 days supply | Qty: 1581 | Fill #1

## 2016-06-18 ENCOUNTER — Ambulatory Visit
Admission: RE | Admit: 2016-06-18 | Discharge: 2016-06-18 | Disposition: A | Discharge status: Home / Self Care | Payer: 59 | Source: Ambulatory Visit | Attending: Family Medicine | Admitting: Family Medicine

## 2016-06-18 DIAGNOSIS — Z1231 Encounter for screening mammogram for malignant neoplasm of breast: Secondary | ICD-10-CM | POA: Diagnosis not present

## 2016-08-16 MED FILL — SUMATRIPTAN SUCC 100 MG TAB: 100 | 30 days supply | Qty: 18 | Fill #1

## 2016-08-24 DIAGNOSIS — H524 Presbyopia: Secondary | ICD-10-CM | POA: Diagnosis not present

## 2016-09-01 ENCOUNTER — Telehealth: Payer: Self-pay | Admitting: Obstetrics & Gynecology

## 2016-09-01 DIAGNOSIS — N939 Abnormal uterine and vaginal bleeding, unspecified: Secondary | ICD-10-CM

## 2016-09-01 NOTE — Telephone Encounter (Signed)
Please have her come in tomorrow for ultrasound if possible.  Thanks.

## 2016-09-01 NOTE — Telephone Encounter (Signed)
Patient was seen on 04/08/2016. Has had an ablation. Patient was given rx for Micronor and was advised to take for 3 continuous months. States she did not start right away as she did not have a menses for 4 months after appointment. Started spotting and started first pack of POP, reports having a heavy cycle for 10 days with clotting. Patient is now on week 7 of pack and started bleeding again. Changing her pad every 4 hours. Feels bleeding is getting heavier each day. Denies any pain or cramping. Having fatigue. Denies SOB, dizziness, or weakness. Advised Micronor can cause patients to have irregular bleeding during the first 3-6 months as body adjusts. Patient states "I didn't think the purpose was for me to have bleeding, but maybe it was to clean everything out. Since I have had an ablation it seems different to me." Requests understanding of taking Micronor and if she should continue or needs alternative.  Routing to Dr.Silva as Dr.Miller is out today.  Cc: Dr.Miller

## 2016-09-01 NOTE — Telephone Encounter (Signed)
Patient called and said she is taking a new medication and she is now "having a lot of bleeding." She said she is not post menopausal.  Last seen: 04/08/16

## 2016-09-01 NOTE — Telephone Encounter (Signed)
Spoke with patient. Advised of message as seen from [place]. Patient is agreeable. PUS scheduled for 09/02/2016 at 4:30 pm, time okay per Lamont Snowball, RN. Patient is agreeable to date and time. Order placed for precert.  Cc; Lerry Liner  Routing to provider for final review. Patient agreeable to disposition. Will close encounter.

## 2016-09-02 ENCOUNTER — Ambulatory Visit (INDEPENDENT_AMBULATORY_CARE_PROVIDER_SITE_OTHER): Payer: 59

## 2016-09-02 ENCOUNTER — Ambulatory Visit (INDEPENDENT_AMBULATORY_CARE_PROVIDER_SITE_OTHER): Payer: 59 | Admitting: Obstetrics & Gynecology

## 2016-09-02 ENCOUNTER — Encounter: Payer: Self-pay | Admitting: Obstetrics & Gynecology

## 2016-09-02 VITALS — BP 112/70 | HR 60 | Resp 12 | Ht 69.0 in | Wt 150.0 lb

## 2016-09-02 DIAGNOSIS — Z9889 Other specified postprocedural states: Secondary | ICD-10-CM | POA: Diagnosis not present

## 2016-09-02 DIAGNOSIS — N858 Other specified noninflammatory disorders of uterus: Secondary | ICD-10-CM | POA: Diagnosis not present

## 2016-09-02 DIAGNOSIS — N939 Abnormal uterine and vaginal bleeding, unspecified: Secondary | ICD-10-CM

## 2016-09-02 DIAGNOSIS — N921 Excessive and frequent menstruation with irregular cycle: Secondary | ICD-10-CM

## 2016-09-02 NOTE — Progress Notes (Signed)
GYNECOLOGY  VISIT   HPI: 51 y.o. G67P1001 Married Caucasian female here for complaint of irregular bleeding over the past six months.  Pt was seen in February and was having irregular spotting.  She was started on Micronor but actually didn't start it.  Pt states she went about four months without any bleeding but then started to have some spotting.  She decided to start the micronor on 5/25 and then two days later she started to have a heavy flow that lasted for 7 days.  Most of these days were heavy flow requiring tampons and pad use.  She did have times where she passes clots.  She wants to know if the micronor caused this.    She continued the micronor and then had a five day cycle that started.  It is mostly over now and was not as heavy as the bleeding in May.  However, she still having a little bleeding today.  Reports having cramping with the bleeding as well.    H/O endometrial ablation that really did help her cycles in 7/07.  Slowly, this seems to be working less and less for her for cycle control.    GYNECOLOGIC HISTORY: Patient's last menstrual period was 08/28/2016. Contraception: vasectomy Menopausal hormone therapy: none  Patient Active Problem List   Diagnosis Date Noted  . Cognitive changes 07/14/2015  . Migraine 07/14/2015  . SUI (stress urinary incontinence, female) 12/17/2014    Past Medical History:  Diagnosis Date  . Abnormal Pap smear of cervix   . Headache(784.0)    menstrual migraines  . HPV (human papilloma virus) infection    condyloma    Past Surgical History:  Procedure Laterality Date  . GYNECOLOGIC CRYOSURGERY  1984  . HYSTEROSCOPY W/D&C  2006  . LEEP  1996   C[facility] [place]  7/07  . RECTAL POLYPECTOMY  2001   anal polyp  . SKIN BIOPSY     area froze off-left shoulder  . VARICOSE VEIN SURGERY      MEDS:  Reviewed in EPIC and UTD  ALLERGIES: Patient has no known allergies.  Family History  Problem Relation Age of Onset  .  Cancer Maternal Grandmother        endometrial  . Breast cancer Mother   . Hypertension Mother   . Coronary artery disease Mother   . Bipolar disorder Mother   . Osteoporosis Mother   . Pancreatic cancer Maternal Grandfather   . Hypertension Father   . Coronary artery disease Father   . Melanoma Father   . Hypertension Other        all grandparents  . Bipolar disorder Brother     SH:  Newly married, non smoker  Review of Systems  All other systems reviewed and are negative.   PHYSICAL EXAMINATION:    BP 112/70 (BP Location: Right Arm, Patient Position: Sitting, Cuff Size: Normal)   Pulse 60   Resp 12   Ht 5\' 9"  (1.753 m)   Wt 150 lb (68 kg)   LMP 08/28/2016   BMI 22.15 kg/m     General appearance: alert, cooperative and appears stated age CV:  Regular rate and rhythm Lungs:  clear to auscultation, no wheezes, rales or rhonchi, symmetric air entry Abdomen: soft, non-tender; bowel sounds normal; no masses,  no organomegaly  Pelvic: External genitalia:  no lesions              Urethra:  normal appearing urethra with no masses, tenderness  or lesions              Bartholins and Skenes: normal                 Vagina: normal appearing vagina with normal color and discharge, no lesions, bleeding present              Cervix: no lesions              Bimanual Exam:  Uterus:  normal size, contour, position, consistency, mobility, non-tender              Adnexa: no mass, fullness, tenderness   Ultrasound: Uterus:  7.5 x 4.7 x 4.5cm Endometrium:  4.34mm Left ovary:  2.5 x 2.0 x 2.0cm Right ovary:  2.4 x 2.2 x 2.0cm with 0.1VC x 9.4WH follicle Cul se sac:  Neg  Chaperone was present for exam.  Endometrial biopsy recommended.  Discussed with patient.  Verbal and written consent obtained.   Procedure:  Speculum placed.  Cervix visualized and cleansed with betadine prep.  A single toothed tenaculum was applied to the anterior lip of the cervix.  Endometrial pipelle was advanced  through the cervix into the endometrial cavity without difficulty.  Pipelle passed to 6cm.  Suction applied and pipelle removed with good tissue sample obtained.  Tenculum removed.  No bleeding noted.  Patient tolerated procedure well.   Assessment: Menorrhagia with irregular cycles On micronor x 2 months  Plan: Continue micronor Endometrial biopsy results will be called to pt.  She states "as long as everything is ok, I can tolerate a lot."   ~25 minutes spent with patient >50% of time was in face to face discussion of above.

## 2016-12-22 DIAGNOSIS — L988 Other specified disorders of the skin and subcutaneous tissue: Secondary | ICD-10-CM | POA: Diagnosis not present

## 2016-12-22 DIAGNOSIS — L570 Actinic keratosis: Secondary | ICD-10-CM | POA: Diagnosis not present

## 2016-12-22 DIAGNOSIS — D225 Melanocytic nevi of trunk: Secondary | ICD-10-CM | POA: Diagnosis not present

## 2016-12-22 DIAGNOSIS — L814 Other melanin hyperpigmentation: Secondary | ICD-10-CM | POA: Diagnosis not present

## 2017-01-25 ENCOUNTER — Other Ambulatory Visit: Payer: Self-pay | Admitting: Obstetrics & Gynecology

## 2017-01-25 NOTE — Telephone Encounter (Signed)
Medication refill request: Maxalt  Last AEX:  12-17-14 Last OV: 09-02-16  Next AEX: 07-08-17 Last MMG (if hormonal medication request): 06-18-16 WNL  Refill authorized: please advise

## 2017-01-26 MED FILL — POLYETHYLENE GLYCOL 3350 PO: 90 days supply | Qty: 1581 | Fill #0

## 2017-01-26 MED FILL — RIZATRIPTAN 10 MG TABLET: 10 | 30 days supply | Qty: 9 | Fill #0

## 2017-01-26 MED FILL — FLUTICASONE PROP 50 MCG SPR: 50 | 90 days supply | Qty: 48 | Fill #0

## 2017-05-16 ENCOUNTER — Encounter: Payer: Self-pay | Admitting: Obstetrics & Gynecology

## 2017-05-16 ENCOUNTER — Other Ambulatory Visit: Payer: Self-pay

## 2017-05-16 ENCOUNTER — Ambulatory Visit (INDEPENDENT_AMBULATORY_CARE_PROVIDER_SITE_OTHER): Payer: 59 | Admitting: Obstetrics & Gynecology

## 2017-05-16 VITALS — BP 106/70 | HR 80 | Resp 16 | Ht 69.0 in | Wt 145.0 lb

## 2017-05-16 DIAGNOSIS — R232 Flushing: Secondary | ICD-10-CM | POA: Diagnosis not present

## 2017-05-16 DIAGNOSIS — N951 Menopausal and female climacteric states: Secondary | ICD-10-CM | POA: Diagnosis not present

## 2017-05-16 MED ORDER — ESTRADIOL 0.0375 MG/24HR TD PTTW: 1.0000 | MEDICATED_PATCH | TRANSDERMAL | 2 refills | Status: DC

## 2017-05-16 MED ORDER — PROGESTERONE MICRONIZED 100 MG PO CAPS: ORAL_CAPSULE | ORAL | 2 refills | Status: DC

## 2017-05-16 NOTE — Progress Notes (Signed)
GYNECOLOGY  VISIT  CC:   Hot flahses  HPI: 52 y.o. G75P1001 Married Caucasian female here for discussion of vasomotor symptoms including hot flashes and sleep disturbance and possible treatment options.  Symptoms have been going on about two months.  Feels like she is sleeping ok except the hot flashes wake her up and cause her to not feel rested.  Has also started noticing some increased vaginal dryness especially with intercourse.  Otherwise, dryness if really not a problem.    Did try black cohosh and this did not help.  Not sure if took enough or correctly.    Denies vaginal bleeding now for several months.  Headaches are much better.    She is interested in options both hormonal and non hormonal.  We discussed herbal remedies, gabapentin, and SSRI/SNRI use.  She does not want to use any medication that is also used for anxiety/depression due to side effects in the past with lexapro.  Discussed with patient risks and benefits and specifically the WHI study including but not limited to risks of increased risks of heart disease, MI, stroke, DVT, and breast cancer.   Possibility of bleeding was discussed as patient does not have a uterus.  Benefits of improved quality of life, improved bone density and decreased risks of colon cancer also discussed.   Does have family hx of breast cancer in her mother who also took HRT "for years".  GYNECOLOGIC HISTORY: No LMP recorded. (Menstrual status: Perimenopausal). Contraception: post menopausal  Menopausal hormone therapy: none  Patient Active Problem List   Diagnosis Date Noted  . Cognitive changes 07/14/2015  . Migraine 07/14/2015  . SUI (stress urinary incontinence, female) 12/17/2014    Past Medical History:  Diagnosis Date  . Abnormal Pap smear of cervix   . Headache(784.0)    menstrual migraines  . HPV (human papilloma virus) infection    condyloma    Past Surgical History:  Procedure Laterality Date  . GYNECOLOGIC CRYOSURGERY   1984  . HYSTEROSCOPY W/D&C  2006  . LEEP  1996   C[facility] [place]  7/07  . RECTAL POLYPECTOMY  2001   anal polyp  . SKIN BIOPSY     area froze off-left shoulder  . VARICOSE VEIN SURGERY      MEDS:   Current Outpatient Medications on File Prior to Visit  Medication Sig Dispense Refill  . cetirizine (ZYRTEC) 10 MG tablet Take 10 mg by mouth daily. As needed    . cholecalciferol (VITAMIN D) 1000 units tablet Take 1,000 Units by mouth daily.    . fluticasone (FLONASE) 50 MCG/ACT nasal spray Place 2 sprays into the nose as needed.     Marland Kitchen MAGNESIUM PO Take by mouth.    . Multiple Vitamins-Minerals (MULTIVITAMIN PO) Take by mouth.    . polyethylene glycol (MIRALAX / GLYCOLAX) packet Take 17 g by mouth daily.    . rizatriptan (MAXALT) 10 MG tablet TAKE 1 TABLET (10 MG TOTAL) BY MOUTH AS NEEDED FOR MIGRAINE. MAY REPEAT IN 2 HOURS IF NEEDED 9 tablet 10   No current facility-administered medications on file prior to visit.     ALLERGIES: Patient has no known allergies.  Family History  Problem Relation Age of Onset  . Cancer Maternal Grandmother        endometrial  . Breast cancer Mother   . Hypertension Mother   . Coronary artery disease Mother   . Bipolar disorder Mother   . Osteoporosis Mother   .  Pancreatic cancer Maternal Grandfather   . Hypertension Father   . Coronary artery disease Father   . Melanoma Father   . Hypertension Other        all grandparents  . Bipolar disorder Brother     SH:  Married, non smoker  Review of Systems  All other systems reviewed and are negative.   PHYSICAL EXAMINATION:    BP 106/70 (BP Location: Right Arm, Patient Position: Sitting, Cuff Size: Normal)   Pulse 80   Resp 16   Ht 5\' 9"  (1.753 m)   Wt 145 lb (65.8 kg)   BMI 21.41 kg/m     General appearance: alert, cooperative and appears stated age  Pelvic: External genitalia:  no lesions              Urethra:  normal appearing urethra with no masses, tenderness or  lesions              Bartholins and Skenes: normal                 Vagina: atrophic changes, no bleeding or discharge, no lesions              Cervix: no lesions  Chaperone was present for exam.  Assessment: Vasomotor symptoms including hot flashes, sleep disturbance, and vaginal dryness/painful intercourse  Plan: FSH and estradiol obtained After discussion, she decided she would like to try HRT.  Will start with Estradiol patches 0.0375mg  patches twice weekly.  She will use this in April and then start Prometrium 100mg  nightly days 1-15 each month.  Has AEX scheduled 07/08/17 so will keep this appt and recheck on symptoms at that time.   ~20 minutes spent with patient >50% of time was in face to face discussion of above.

## 2017-05-17 LAB — ESTRADIOL: Estradiol: <5 pg/mL

## 2017-05-17 LAB — FOLLICLE STIMULATING HORMONE: FSH: 158.7 m[IU]/mL

## 2017-05-17 MED FILL — ESTRADIOL PATCH 0.0375: 0.0375 | 28 days supply | Qty: 8 | Fill #0

## 2017-05-17 MED FILL — PROGESTERONE 100 MG CAPSULE: 100 | 15 days supply | Qty: 15 | Fill #0

## 2017-06-02 ENCOUNTER — Telehealth: Payer: Self-pay | Admitting: Obstetrics & Gynecology

## 2017-06-02 ENCOUNTER — Encounter: Payer: Self-pay | Admitting: Obstetrics & Gynecology

## 2017-06-02 NOTE — Telephone Encounter (Signed)
Patient sent the following correspondence through [place]. Routing to triage to assist patient with request.  ----- Message from [place], Generic sent at 06/02/2017 9:59 AM EDT -----    Hi, I have two quick questions.    (1) I've been using the estradiol patch for a little over two weeks. I have about 4-5 hot flashes at night that wake me up. Does it need more time to kick in or do I need a dose adjustment?    (2) When I'm taking the progesterone (starting 5/1) do I keep wearing the estrogen patch or do I take it off while taking progesterone?    Thanks very much.  Please leave me a voice mail or text on my cell phone with your thoughts.  Taylor Townsend.  Cell phone [phone]

## 2017-06-02 NOTE — Telephone Encounter (Signed)
Spoke with patient. Advised can take up to 6 wks for full effect of estradiol patch, continue using as prescribed, f/u with Dr. Sabra Heck at [facility] on 07/08/17. Advised to continue patch as prescribed while taking progesterone. Patient verbalizes understanding and is agreeable.   Routing to provider for final review. Patient is agreeable to disposition. Will close encounter.

## 2017-06-10 MED FILL — ESTRADIOL PATCH 0.0375: 0.0375 | 28 days supply | Qty: 8 | Fill #1

## 2017-06-28 DIAGNOSIS — Z131 Encounter for screening for diabetes mellitus: Secondary | ICD-10-CM | POA: Diagnosis not present

## 2017-06-28 DIAGNOSIS — J309 Allergic rhinitis, unspecified: Secondary | ICD-10-CM | POA: Diagnosis not present

## 2017-06-28 DIAGNOSIS — N951 Menopausal and female climacteric states: Secondary | ICD-10-CM | POA: Diagnosis not present

## 2017-06-28 DIAGNOSIS — Z13220 Encounter for screening for lipoid disorders: Secondary | ICD-10-CM | POA: Diagnosis not present

## 2017-06-28 DIAGNOSIS — K5900 Constipation, unspecified: Secondary | ICD-10-CM | POA: Diagnosis not present

## 2017-06-28 DIAGNOSIS — G43909 Migraine, unspecified, not intractable, without status migrainosus: Secondary | ICD-10-CM | POA: Diagnosis not present

## 2017-06-28 DIAGNOSIS — Z86010 Personal history of colonic polyps: Secondary | ICD-10-CM | POA: Diagnosis not present

## 2017-06-28 DIAGNOSIS — Z6821 Body mass index (BMI) 21.0-21.9, adult: Secondary | ICD-10-CM | POA: Diagnosis not present

## 2017-06-28 DIAGNOSIS — Z8262 Family history of osteoporosis: Secondary | ICD-10-CM | POA: Diagnosis not present

## 2017-06-28 DIAGNOSIS — Z0000 Encounter for general adult medical examination without abnormal findings: Secondary | ICD-10-CM | POA: Diagnosis not present

## 2017-06-29 ENCOUNTER — Other Ambulatory Visit: Payer: Self-pay | Admitting: Family Medicine

## 2017-06-29 DIAGNOSIS — Z780 Asymptomatic menopausal state: Secondary | ICD-10-CM

## 2017-06-29 DIAGNOSIS — Z139 Encounter for screening, unspecified: Secondary | ICD-10-CM

## 2017-07-08 ENCOUNTER — Ambulatory Visit (INDEPENDENT_AMBULATORY_CARE_PROVIDER_SITE_OTHER): Payer: 59 | Admitting: Obstetrics & Gynecology

## 2017-07-08 ENCOUNTER — Other Ambulatory Visit: Payer: Self-pay

## 2017-07-08 ENCOUNTER — Encounter: Payer: Self-pay | Admitting: Obstetrics & Gynecology

## 2017-07-08 VITALS — BP 108/60 | HR 64 | Resp 16 | Ht 68.75 in | Wt 148.6 lb

## 2017-07-08 DIAGNOSIS — Z01419 Encounter for gynecological examination (general) (routine) without abnormal findings: Secondary | ICD-10-CM

## 2017-07-08 DIAGNOSIS — Z202 Contact with and (suspected) exposure to infections with a predominantly sexual mode of transmission: Secondary | ICD-10-CM

## 2017-07-08 MED ORDER — ESTRADIOL 0.0375 MG/24HR TD PTTW: 1.0000 | MEDICATED_PATCH | TRANSDERMAL | 4 refills | Status: DC

## 2017-07-08 MED ORDER — MEDROXYPROGESTERONE ACETATE 2.5 MG PO TABS: ORAL_TABLET | ORAL | 2 refills | Status: DC

## 2017-07-08 NOTE — Progress Notes (Signed)
52 y.o. G1P1001 MarriedCaucasianF here for annual exam.  Hot flashes are much, much better.  Having a lot of fatigue with the progesterone.  Having a light cycle right now that started 07/04/17.  Having just spotting today.    PCP:  Dr. Sabra Heck.  Blood work was done 06/28/17 with Dr. Kathyrn Lass   Patient's last menstrual period was 07/04/2017.          Sexually active: Yes.    The current method of family planning is vasectomy.    Exercising: No.   Smoker:  no  Health Maintenance: Pap:  04/08/16 Neg. HR HPV:neg    12/17/14 Neg History of abnormal Pap:  Yes, LEEP 1996 MMG:  06/18/16 BIRADS1:Neg. Has appt 08/16/17  Colonoscopy:  08/05/14 polyp. F/u 5 years  BMD: has appt  07/2017 TDaP:  2015 Pneumonia vaccine(s):  n/a Shingrix:   No Hep C testing: 05/15/12 Neg  Screening Labs: PCP   reports that she has never smoked. She has never used smokeless tobacco. She reports that she drinks alcohol. She reports that she does not use drugs.  Past Medical History:  Diagnosis Date  . Abnormal Pap smear of cervix   . Headache(784.0)    menstrual migraines  . HPV (human papilloma virus) infection    condyloma    Past Surgical History:  Procedure Laterality Date  . GYNECOLOGIC CRYOSURGERY  1984  . HYSTEROSCOPY W/D&C  2006  . LEEP  1996   CIN [place]  7/07  . RECTAL POLYPECTOMY  2001   anal polyp  . SKIN BIOPSY     area froze off-left shoulder  . VARICOSE VEIN SURGERY      Current Outpatient Medications  Medication Sig Dispense Refill  . cetirizine (ZYRTEC) 10 MG tablet Take 10 mg by mouth daily. As needed    . cholecalciferol (VITAMIN D) 1000 units tablet Take 1,000 Units by mouth daily.    Marland Kitchen estradiol (VIVELLE-DOT) 0.0375 MG/24HR Place 1 patch onto the skin 2 (two) times a week. 8 patch 2  . fluticasone (FLONASE) 50 MCG/ACT nasal spray Place 2 sprays into the nose as needed.     Marland Kitchen MAGNESIUM PO Take by mouth daily.     . Multiple Vitamins-Minerals (MULTIVITAMIN PO) Take by  mouth.    . polyethylene glycol (MIRALAX / GLYCOLAX) packet Take 17 g by mouth daily.    . progesterone (PROMETRIUM) 100 MG capsule Take nightly days 1-15 each month 15 capsule 2  . rizatriptan (MAXALT) 10 MG tablet TAKE 1 TABLET (10 MG TOTAL) BY MOUTH AS NEEDED FOR MIGRAINE. MAY REPEAT IN 2 HOURS IF NEEDED 9 tablet 10   No current facility-administered medications for this visit.     Family History  Problem Relation Age of Onset  . Cancer Maternal Grandmother        endometrial  . Breast cancer Mother   . Hypertension Mother   . Coronary artery disease Mother   . Bipolar disorder Mother   . Osteoporosis Mother   . Pancreatic cancer Maternal Grandfather   . Hypertension Father   . Coronary artery disease Father   . Melanoma Father   . Hypertension Other        all grandparents  . Bipolar disorder Brother     Review of Systems  Gastrointestinal: Positive for constipation.  Genitourinary:       Unscheduled bleeding Spotting with intercourse   Musculoskeletal: Positive for myalgias.  All other systems reviewed and are negative.  Exam:   BP 108/60 (BP Location: Right Arm, Patient Position: Sitting, Cuff Size: Normal)   Pulse 64   Resp 16   Ht 5' 8.75" (1.746 m)   Wt 148 lb 9.6 oz (67.4 kg)   LMP 07/04/2017   BMI 22.10 kg/m   Height: 5' 8.75" (174.6 cm)  Ht Readings from Last 3 Encounters:  07/08/17 5' 8.75" (1.746 m)  05/16/17 5\' 9"  (1.753 m)  09/02/16 5\' 9"  (1.753 m)    General appearance: alert, cooperative and appears stated age Head: Normocephalic, without obvious abnormality, atraumatic Neck: no adenopathy, supple, symmetrical, trachea midline and thyroid normal to inspection and palpation Lungs: clear to auscultation bilaterally Breasts: normal appearance, no masses or tenderness Heart: regular rate and rhythm Abdomen: soft, non-tender; bowel sounds normal; no masses,  no organomegaly Extremities: extremities normal, atraumatic, no cyanosis or edema Skin:  Skin color, texture, turgor normal. No rashes or lesions Lymph nodes: Cervical, supraclavicular, and axillary nodes normal. No abnormal inguinal nodes palpated Neurologic: Grossly normal   Pelvic: External genitalia:  no lesions              Urethra:  normal appearing urethra with no masses, tenderness or lesions              Bartholins and Skenes: normal                 Vagina: normal appearing vagina with normal color and discharge, no lesions              Cervix: no lesions              Pap taken: No. Bimanual Exam:  Uterus:  normal size, contour, position, consistency, mobility, non-tender              Adnexa: normal adnexa and no mass, fullness, tenderness               Rectovaginal: Confirms               Anus:  normal sphincter tone, no lesions  Chaperone was present for exam.  A:  Well Woman with normal exam PMP, on HRT H/o abnormal pap smear with cryo and then LEEP 1996 H/O breast cancer in mother H/o endometrial cancer in [facility]  P:   Mammogram guidelines reviewed.  Yearly recommended due to HRT pap smear with neg HR HPV 2018.  No pap smear obtained today. HIV, Hep C, and Hep B SAg obtained today RF for Vivelle dot 0.0375mg  patches twice weekly.  #24/4RF and Provera 2.5mg  daily.  #90/4RF. Colonoscopy is UTD. Shingrix vaccination discussed. Return annually or prn

## 2017-07-09 LAB — HEPATITIS B SURFACE ANTIGEN: Hepatitis B Surface Ag: NEGATIVE

## 2017-07-09 LAB — HIV ANTIBODY (ROUTINE TESTING W REFLEX): HIV Screen 4th Generation wRfx: NONREACTIVE

## 2017-07-09 LAB — HEPATITIS C ANTIBODY: Hep C Virus Ab: <0.1 s/co ratio (ref 0.0–0.9)

## 2017-07-22 ENCOUNTER — Telehealth: Payer: Self-pay | Admitting: Obstetrics & Gynecology

## 2017-07-27 MED FILL — ESTRADIOL PATCH 0.0375: 0.0375 | 84 days supply | Qty: 24 | Fill #0

## 2017-07-27 MED FILL — MEDROXYPROGESTERONE 2.5 MG: 2.5 | 15 days supply | Qty: 15 | Fill #0

## 2017-08-15 ENCOUNTER — Other Ambulatory Visit: Payer: Self-pay | Admitting: Family Medicine

## 2017-08-15 DIAGNOSIS — N951 Menopausal and female climacteric states: Secondary | ICD-10-CM

## 2017-08-16 ENCOUNTER — Ambulatory Visit
Admission: RE | Admit: 2017-08-16 | Discharge: 2017-08-16 | Disposition: A | Discharge status: Home / Self Care | Payer: 59 | Source: Ambulatory Visit | Attending: Family Medicine | Admitting: Family Medicine

## 2017-08-16 DIAGNOSIS — N951 Menopausal and female climacteric states: Secondary | ICD-10-CM

## 2017-08-16 DIAGNOSIS — Z1231 Encounter for screening mammogram for malignant neoplasm of breast: Secondary | ICD-10-CM | POA: Diagnosis not present

## 2017-08-16 DIAGNOSIS — M8589 Other specified disorders of bone density and structure, multiple sites: Secondary | ICD-10-CM | POA: Diagnosis not present

## 2017-08-16 DIAGNOSIS — Z139 Encounter for screening, unspecified: Secondary | ICD-10-CM

## 2017-08-16 DIAGNOSIS — Z780 Asymptomatic menopausal state: Secondary | ICD-10-CM | POA: Diagnosis not present

## 2017-09-16 ENCOUNTER — Telehealth: Payer: Self-pay | Admitting: Obstetrics & Gynecology

## 2017-09-16 NOTE — Telephone Encounter (Signed)
Patient called with concerns about using her estradiol patches. She said she is having trouble remembering to change them or washing them off. As a result, she is running out of her patches early. She'd like to know if there is another alternative for her that may be easier to adhere to.

## 2017-09-16 NOTE — Telephone Encounter (Signed)
Spoke with patient. Experiencing difficulty with estrogen patches, unable to keep them in place, has tried alternative sites and keeping skin free of lotion. Has trouble remembering to remove and replace. Having trouble keeping up with provera days 1-15. Patient asking for alternative, such as daily pill, feels this will work better for her.   Advised will review with Dr. Sabra Heck when she returns to the office next week and return call, patient agreeable.  Routing to Dr. Sabra Heck to advise.

## 2017-09-19 NOTE — Telephone Encounter (Signed)
Ok to switch to estradiol 0.5mg  daily.  Ok to sent to pharmacy.  There is not an equivalent dosage orally for the 0.0375mg  patches, just FYI for Mrs. Bommarito.

## 2017-09-20 MED ORDER — ESTRADIOL 0.5 MG PO TABS: 0.5000 mg | ORAL_TABLET | Freq: Every day | ORAL | 3 refills | Status: DC

## 2017-09-20 MED FILL — ESTRADIOL 0.5 MG TABLET: 0.5 | 90 days supply | Qty: 90 | Fill #0

## 2017-09-20 NOTE — Telephone Encounter (Signed)
Spoke with patient, advised as seen below per Dr. Sabra Heck. Rx for estradiol #90/3RF to verified pharmacy. Patient verbalizes understanding and is agreeable. Encounter closed.

## 2017-09-26 ENCOUNTER — Telehealth: Payer: Self-pay | Admitting: Obstetrics & Gynecology

## 2017-09-26 MED FILL — MEDROXYPROGESTERONE 2.5 MG: 2.5 | 15 days supply | Qty: 15 | Fill #1

## 2017-09-26 NOTE — Telephone Encounter (Signed)
Spoke with Richardson Landry at [facility]. Confirmed Rx Provera 2.5 mg and estradiol 0.5mg  on file. Was advised both Rx available and ready for pick up.   Call returned to patient. Advised as seen above. Patient verbalizes understanding.   Routing to provider for final review. Patient is agreeable to disposition. Will close encounter.

## 2017-09-26 NOTE — Telephone Encounter (Signed)
Patient states she has two prescriptions at the pharmacy and she wants clarification on which one she needs to pick up. Patient states we can just call the pharmacy and advised with prescription to fill. Please advise.    [facility] on [facility] [address] [phone]

## 2017-10-25 ENCOUNTER — Other Ambulatory Visit: Payer: Self-pay | Admitting: Obstetrics & Gynecology

## 2017-10-25 MED FILL — MEDROXYPROGESTERONE 2.5 MG: 2.5 | 15 days supply | Qty: 15 | Fill #0

## 2017-10-25 NOTE — Telephone Encounter (Signed)
Medication refill request: Provera Last AEX:  07/08/17 SM Next AEX: 10/17/18 Last MMG (if hormonal medication request): 08/16/17 BIRADS 1 negative/density c Refill authorized: 07/08/17 #15 w/2 refills; today please advise

## 2017-11-10 DIAGNOSIS — B351 Tinea unguium: Secondary | ICD-10-CM | POA: Diagnosis not present

## 2017-11-10 DIAGNOSIS — D485 Neoplasm of uncertain behavior of skin: Secondary | ICD-10-CM | POA: Diagnosis not present

## 2017-11-10 DIAGNOSIS — L814 Other melanin hyperpigmentation: Secondary | ICD-10-CM | POA: Diagnosis not present

## 2017-11-10 DIAGNOSIS — D225 Melanocytic nevi of trunk: Secondary | ICD-10-CM | POA: Diagnosis not present

## 2017-11-10 DIAGNOSIS — L821 Other seborrheic keratosis: Secondary | ICD-10-CM | POA: Diagnosis not present

## 2017-11-23 MED FILL — MEDROXYPROGESTERONE 2.5 MG: 2.5 | 15 days supply | Qty: 15 | Fill #1

## 2017-11-25 DIAGNOSIS — L608 Other nail disorders: Secondary | ICD-10-CM | POA: Diagnosis not present

## 2017-12-16 MED FILL — MEDROXYPROGESTERONE 2.5 MG: 2.5 | 15 days supply | Qty: 15 | Fill #2

## 2017-12-16 MED FILL — ESTRADIOL 0.5 MG TABLET: 0.5 | 90 days supply | Qty: 90 | Fill #1

## 2017-12-29 DIAGNOSIS — H524 Presbyopia: Secondary | ICD-10-CM | POA: Diagnosis not present

## 2018-01-20 MED FILL — MEDROXYPROGESTERONE 2.5 MG: 2.5 | 15 days supply | Qty: 15 | Fill #3

## 2018-02-07 ENCOUNTER — Telehealth: Payer: Self-pay | Admitting: Obstetrics & Gynecology

## 2018-02-07 NOTE — Telephone Encounter (Signed)
Call to patient. On oral HRT since spring 2019 and onset of light spotting all day today. Denies missed or late pills.  Office visit scheduled for 02-09-18.  Discussed possible endometrial biopsy to evaluate endometrial cells. Advised to take Motrin 800 mg one hour prior with food.   Routing to Dr Sabra Heck. Encounter closed.

## 2018-02-07 NOTE — Telephone Encounter (Signed)
Patient says she started spotting today.

## 2018-02-09 ENCOUNTER — Ambulatory Visit: Payer: 59 | Admitting: Obstetrics & Gynecology

## 2018-02-09 ENCOUNTER — Encounter: Payer: Self-pay | Admitting: Obstetrics & Gynecology

## 2018-02-09 VITALS — BP 112/60 | HR 72 | Resp 16 | Ht 68.75 in | Wt 148.2 lb

## 2018-02-09 DIAGNOSIS — N950 Postmenopausal bleeding: Secondary | ICD-10-CM

## 2018-02-09 MED ORDER — MEDROXYPROGESTERONE ACETATE 5 MG PO TABS
ORAL_TABLET | ORAL | 3 refills | Status: DC
Start: 2018-02-09 — End: 2018-11-13

## 2018-02-09 MED FILL — MEDROXYPROGESTERONE 5 MG TA: 5 | 90 days supply | Qty: 45 | Fill #0

## 2018-02-09 NOTE — Progress Notes (Signed)
GYNECOLOGY  VISIT  CC:   Brown spotting  HPI: 52 y.o. G15P1001 Married White or Caucasian female here for irregular light spotting that started 02/06/18 with just spotting.  Bleeding is dark brown in color.  It really hasn't changed since starting.  Is not bright red.  Denies significant cramping. Does not want an endometrial biopsy if possible today.  She did have one 09/02/16 due to irregular bleeding.  This was negative for abnormal cells.    GYNECOLOGIC HISTORY: Patient's last menstrual period was 04/22/2017 (approximate). Contraception: vasectomy  Menopausal hormone therapy: Provera and estradiol   Patient Active Problem List   Diagnosis Date Noted  . Cognitive changes 07/14/2015  . Migraine 07/14/2015  . SUI (stress urinary incontinence, female) 12/17/2014    Past Medical History:  Diagnosis Date  . Abnormal Pap smear of cervix   . Headache(784.0)    menstrual migraines  . HPV (human papilloma virus) infection    condyloma    Past Surgical History:  Procedure Laterality Date  . GYNECOLOGIC CRYOSURGERY  1984  . HYSTEROSCOPY W/D&C  2006  . LEEP  1996   CIN [place]  7/07  . RECTAL POLYPECTOMY  2001   anal polyp  . SKIN BIOPSY     area froze off-left shoulder  . VARICOSE VEIN SURGERY      MEDS:   Current Outpatient Medications on File Prior to Visit  Medication Sig Dispense Refill  . cetirizine (ZYRTEC) 10 MG tablet Take 10 mg by mouth daily. As needed    . cholecalciferol (VITAMIN D) 1000 units tablet Take 1,000 Units by mouth daily.    Marland Kitchen estradiol (ESTRACE) 0.5 MG tablet Take 1 tablet (0.5 mg total) by mouth daily. 90 tablet 3  . fluticasone (FLONASE) 50 MCG/ACT nasal spray Place 2 sprays into the nose as needed.     Marland Kitchen MAGNESIUM PO Take by mouth daily.     . medroxyPROGESTERone (PROVERA) 2.5 MG tablet TAKE 1 TABLET BY MOUTH ON DAYS 1-15 OF EACH MONTH 15 tablet 10  . Multiple Vitamins-Minerals (MULTIVITAMIN PO) Take by mouth.    . polyethylene  glycol (MIRALAX / GLYCOLAX) packet Take 17 g by mouth daily.    . rizatriptan (MAXALT) 10 MG tablet TAKE 1 TABLET (10 MG TOTAL) BY MOUTH AS NEEDED FOR MIGRAINE. MAY REPEAT IN 2 HOURS IF NEEDED 9 tablet 10   No current facility-administered medications on file prior to visit.     ALLERGIES: Patient has no known allergies.  Family History  Problem Relation Age of Onset  . Cancer Maternal Grandmother        endometrial  . Breast cancer Mother   . Hypertension Mother   . Coronary artery disease Mother   . Bipolar disorder Mother   . Osteoporosis Mother   . Pancreatic cancer Maternal Grandfather   . Hypertension Father   . Coronary artery disease Father   . Melanoma Father   . Hypertension Other        all grandparents  . Bipolar disorder Brother     SH:  Married, non smoker  Review of Systems  Genitourinary: Positive for vaginal bleeding.  All other systems reviewed and are negative.   PHYSICAL EXAMINATION:    BP 112/60 (BP Location: Left Arm, Patient Position: Sitting, Cuff Size: Normal)   Pulse 72   Resp 16   Ht 5' 8.75" (1.746 m)   Wt 148 lb 3.2 oz (67.2 kg)   LMP 04/22/2017 (Approximate)  BMI 22.04 kg/m     General appearance: alert, cooperative and appears stated age Lymph:  no inguinal LAD noted  Pelvic: External genitalia:  no lesions              Urethra:  normal appearing urethra with no masses, tenderness or lesions              Bartholins and Skenes: normal                 Vagina: normal appearing vagina with normal color and discharge, no lesions              Cervix: no lesions and small amt of dark blood/spotting noted              Bimanual Exam:  Uterus:  normal size, contour, position, consistency, mobility, non-tender              Adnexa: no mass, fullness, tenderness  Chaperone was present for exam.  Assessment: PMP spotting, on HRT Desirous of no endometrial biopsy if possible today  Plan: Change to Provera 5mg  days 1-15 each month.  If has  spotting next month, she is advised to call.  At that time, PUS and/or biopsy would be performed.   ~15 minutes spent with patient >50% of time was in face to face discussion of above.

## 2018-03-13 MED FILL — ESTRADIOL 0.5 MG TABLET: 0.5 | 90 days supply | Qty: 90 | Fill #0

## 2018-03-13 MED FILL — SHIPPING COST: 1 days supply | Qty: 1 | Fill #0

## 2018-05-20 MED FILL — MEDROXYPROGESTERONE 5 MG TA: 5 | 90 days supply | Qty: 45 | Fill #0

## 2018-06-14 MED FILL — ESTRADIOL 0.5 MG TABS: 0.5 | 90 days supply | Qty: 90 | Fill #1

## 2018-07-12 DIAGNOSIS — E538 Deficiency of other specified B group vitamins: Secondary | ICD-10-CM | POA: Diagnosis not present

## 2018-07-12 DIAGNOSIS — J309 Allergic rhinitis, unspecified: Secondary | ICD-10-CM | POA: Diagnosis not present

## 2018-07-12 DIAGNOSIS — M8580 Other specified disorders of bone density and structure, unspecified site: Secondary | ICD-10-CM | POA: Diagnosis not present

## 2018-07-12 DIAGNOSIS — Z13220 Encounter for screening for lipoid disorders: Secondary | ICD-10-CM | POA: Diagnosis not present

## 2018-07-12 DIAGNOSIS — Z800 Family history of malignant neoplasm of digestive organs: Secondary | ICD-10-CM | POA: Diagnosis not present

## 2018-07-12 DIAGNOSIS — Z86010 Personal history of colonic polyps: Secondary | ICD-10-CM | POA: Diagnosis not present

## 2018-07-12 DIAGNOSIS — M859 Disorder of bone density and structure, unspecified: Secondary | ICD-10-CM | POA: Diagnosis not present

## 2018-07-12 DIAGNOSIS — Z0000 Encounter for general adult medical examination without abnormal findings: Secondary | ICD-10-CM | POA: Diagnosis not present

## 2018-07-12 DIAGNOSIS — M79645 Pain in left finger(s): Secondary | ICD-10-CM | POA: Diagnosis not present

## 2018-07-12 DIAGNOSIS — R5383 Other fatigue: Secondary | ICD-10-CM | POA: Diagnosis not present

## 2018-07-12 DIAGNOSIS — K5900 Constipation, unspecified: Secondary | ICD-10-CM | POA: Diagnosis not present

## 2018-07-12 DIAGNOSIS — N951 Menopausal and female climacteric states: Secondary | ICD-10-CM | POA: Diagnosis not present

## 2018-07-12 DIAGNOSIS — G43909 Migraine, unspecified, not intractable, without status migrainosus: Secondary | ICD-10-CM | POA: Diagnosis not present

## 2018-07-18 DIAGNOSIS — M1812 Unilateral primary osteoarthritis of first carpometacarpal joint, left hand: Secondary | ICD-10-CM | POA: Diagnosis not present

## 2018-07-18 DIAGNOSIS — M79642 Pain in left hand: Secondary | ICD-10-CM | POA: Diagnosis not present

## 2018-07-31 MED FILL — MEDROXYPROGESTERONE 5 MG TA: 5 | 90 days supply | Qty: 45 | Fill #1

## 2018-09-01 ENCOUNTER — Telehealth: Payer: Self-pay | Admitting: Obstetrics & Gynecology

## 2018-09-01 DIAGNOSIS — N950 Postmenopausal bleeding: Secondary | ICD-10-CM

## 2018-09-01 NOTE — Telephone Encounter (Signed)
Patient would like to speak with a nurse. She has been spotting for two days.

## 2018-09-04 NOTE — Telephone Encounter (Signed)
Patient returned call

## 2018-09-04 NOTE — Telephone Encounter (Signed)
Encounter closed

## 2018-09-04 NOTE — Telephone Encounter (Signed)
Left message to call Sharee Pimple, RN at [facility].       Per review of OV 02/09/18: Change to Provera 5mg  days 1-15 each month.  If has spotting next month, she is advised to call.  At that time, PUS and/or biopsy would be performed.

## 2018-09-04 NOTE — Telephone Encounter (Signed)
Spoke with patient. Reports spotting that started on 08/31/18. Taking estradiol 0.5mg  PO daily and provera 5mg  PO days 1-15 each mo. Took dose on 7/7 12 hrs late, no missed pills. Denies any other symptoms. Advised per review of last OV 02/09/18, if spotting after changing to provera days 1-15 PUS and/or biopsy recommended. Patient agreeable to proceed with scheduling.   PUS with possible EMB scheduled for 7/16 at 3:30pm, consult at 4pm with Dr. Sabra Heck. Orders placed for precert. Advised Dr. Sabra Heck will review, our office will return call if any additional recommendations, patient agreeable.   Dr. Sabra Heck -ok to proceed as scheduled?   Cc: Lerry Liner

## 2018-09-04 NOTE — Telephone Encounter (Signed)
Yes, ok to proceed with PUS and then possible endometrial biopsy.  Thanks.

## 2018-09-05 ENCOUNTER — Other Ambulatory Visit: Payer: Self-pay

## 2018-09-07 ENCOUNTER — Other Ambulatory Visit: Payer: Self-pay

## 2018-09-07 ENCOUNTER — Ambulatory Visit (INDEPENDENT_AMBULATORY_CARE_PROVIDER_SITE_OTHER): Payer: 59

## 2018-09-07 ENCOUNTER — Ambulatory Visit (INDEPENDENT_AMBULATORY_CARE_PROVIDER_SITE_OTHER): Payer: 59 | Admitting: Obstetrics & Gynecology

## 2018-09-07 DIAGNOSIS — N950 Postmenopausal bleeding: Secondary | ICD-10-CM | POA: Diagnosis not present

## 2018-09-07 NOTE — Progress Notes (Signed)
53 y.o. G26P1001 Married White or Caucasian female here for pelvic ultrasound due to PMP bleeding that occurred two days after taking her HRT about 12 hours late.  She typically take the HRT on schedule so this has really never happened before.  Bleeding was light and there was a little cramping and it stopped on it's own.  Ultrasound was recommended.  She's had an endometrial biopsy in the past that was rather painful for her on 09/02/2016.  She did have some spotting in December as well.  She is considering tapering her HRT and would like some directions about this.  Does have AEX 10/17/2018 so this is a good interval of time to start this.  She will decrease the estradiol in 1/2 to 0.25mg  daily and the provera 5mg  to 1/2 tablet for 2.5mg  days 1-15 each month.    No LMP recorded. (Menstrual status: Perimenopausal).  Contraception: PMP  Findings:  UTERUS: 6.3 x 4.3 x 3.4cm EMS: 3.2 - 3.54mm (scatter echogenic foci that are non vascular are noted throughout endometrium, pt has hx of endometrial ablation) ADNEXA: Left ovary:  2.1 x 1.1 x 0.8cm       Right ovary: 1.5 x 0.7 x 1.0cm CUL DE SAC: no free fluid  Discussion:  Findings reviewed.  Endometrium is <23mm so do not feel biopsy is needed today.  Instructions for tapering her HRT discussed.  She is going to have if she has any significant change in symptoms.  Otherwise, I will plan to see her back 10/17/2018.Marland Kitchen  Assessment:  PMP bleeding after taking HRT 12 hours late H/o endometrial ablation Endometrium on PUS today <78mm  Plan:  She will decrease her estradiol to 0.25mg  daily and the provera to 1.5mg  days 1-5 each month. She will return for AEX 10/17/2018 and we will discuss symptoms at that time.  ~20 minutes spent with patient >50% of time was in face to face discussion of above.

## 2018-09-08 ENCOUNTER — Encounter: Payer: Self-pay | Admitting: Obstetrics & Gynecology

## 2018-09-13 ENCOUNTER — Other Ambulatory Visit: Payer: Self-pay | Admitting: Obstetrics & Gynecology

## 2018-09-13 MED ORDER — ESTRADIOL 0.5 MG PO TABS: 0.5000 mg | ORAL_TABLET | Freq: Every day | ORAL | 0 refills | Status: DC

## 2018-09-13 MED FILL — ESTRADIOL 0.5 MG TABS: 0.5 | 30 days supply | Qty: 30 | Fill #0

## 2018-09-13 NOTE — Telephone Encounter (Signed)
Medication refill request: Estradiol Last AEX: 07/08/17  Next AEX: 10/17/18 Last MMG (if hormonal medication request):08/16/17 bi-rads 1 neg  Refill authorized:  #30 with 0 RF

## 2018-09-13 NOTE — Telephone Encounter (Signed)
Patient is asking for a 30 day refill of her estradiol to [facility].

## 2018-10-17 ENCOUNTER — Ambulatory Visit: Payer: 59 | Admitting: Obstetrics & Gynecology

## 2018-10-29 ENCOUNTER — Other Ambulatory Visit: Payer: Self-pay | Admitting: Internal Medicine

## 2018-10-29 MED ORDER — PREDNISONE 10 MG (21) PO TBPK: ORAL_TABLET | ORAL | 1 refills | Status: DC

## 2018-10-29 NOTE — Progress Notes (Signed)
Short course of prednisone ordered for severe poison ivy.

## 2018-11-09 ENCOUNTER — Other Ambulatory Visit: Payer: Self-pay

## 2018-11-13 ENCOUNTER — Telehealth: Payer: Self-pay | Admitting: Obstetrics & Gynecology

## 2018-11-13 ENCOUNTER — Other Ambulatory Visit: Payer: Self-pay

## 2018-11-13 ENCOUNTER — Other Ambulatory Visit: Payer: Self-pay | Admitting: Obstetrics & Gynecology

## 2018-11-13 ENCOUNTER — Other Ambulatory Visit: Payer: Self-pay | Admitting: Family Medicine

## 2018-11-13 ENCOUNTER — Encounter: Payer: Self-pay | Admitting: Obstetrics & Gynecology

## 2018-11-13 ENCOUNTER — Ambulatory Visit (INDEPENDENT_AMBULATORY_CARE_PROVIDER_SITE_OTHER): Payer: 59 | Admitting: Obstetrics & Gynecology

## 2018-11-13 VITALS — BP 126/82 | HR 60 | Temp 97.7°F | Ht 68.25 in | Wt 152.0 lb

## 2018-11-13 DIAGNOSIS — Z01419 Encounter for gynecological examination (general) (routine) without abnormal findings: Secondary | ICD-10-CM

## 2018-11-13 DIAGNOSIS — Z1231 Encounter for screening mammogram for malignant neoplasm of breast: Secondary | ICD-10-CM

## 2018-11-13 MED ORDER — GABAPENTIN 100 MG PO CAPS: ORAL_CAPSULE | ORAL | 1 refills | Status: DC

## 2018-11-13 MED ORDER — ESTRADIOL 0.5 MG PO TABS: 0.5000 mg | ORAL_TABLET | Freq: Every day | ORAL | 5 refills | Status: DC

## 2018-11-13 MED ORDER — RIZATRIPTAN BENZOATE 10 MG PO TABS: 10.0000 mg | ORAL_TABLET | ORAL | 3 refills | Status: DC | PRN

## 2018-11-13 MED ORDER — MEDROXYPROGESTERONE ACETATE 2.5 MG PO TABS: ORAL_TABLET | ORAL | 5 refills | Status: DC

## 2018-11-13 MED ORDER — NONFORMULARY OR COMPOUNDED ITEM: 3 refills | Status: DC

## 2018-11-13 MED FILL — RIZATRIPTAN BENZOATE 10 MG: 10 | 15 days supply | Qty: 9 | Fill #0

## 2018-11-13 MED FILL — MEDROXYPROGESTERONE 2.5 MG: 2.5 | 30 days supply | Qty: 30 | Fill #0

## 2018-11-13 MED FILL — ESTRADIOL 0.5 MG TABS: 0.5 | 30 days supply | Qty: 30 | Fill #0

## 2018-11-13 NOTE — Telephone Encounter (Signed)
I did not sent it to the pharmacy as I thought we were going to wait and see what symptoms she has as she lowered the HRT dosage.  I can sent it to the pharmacy if she desires, just let me know.  Thanks.

## 2018-11-13 NOTE — Telephone Encounter (Signed)
Patient states she would like Rx sent to pharmacy. States she's having hot flashes at night.

## 2018-11-13 NOTE — Telephone Encounter (Signed)
Rx sent to pharmacy on file.  Ok to close encounter.  Thanks.

## 2018-11-13 NOTE — Progress Notes (Signed)
53 y.o. G36P1001 Married White or Caucasian female here for annual exam.  Denies vaginal bleeding.  She does want to try and get off her HRT.  She is having some vaginal dryness.  Not having very many headaches.    Reviewed family hx.  Her mother did NOT have breast cancer.  Tyrer cusick model for pt is 9.3% lifetime risk.  Patient's last menstrual period was 07/04/2017.          Sexually active: Yes.    The current method of family planning is post menopausal status.    Exercising: No.   Smoker:  no  Health Maintenance: Pap:  04/08/16 Neg. HR HPV:neg   12/17/14 neg  History of abnormal Pap:  Yes, LEEP 1996 MMG:  08/16/17 BIRADS1:neg.  Aware this is due.    Colonoscopy:  08/05/14, adenomatous polyp.  Follow up five years.   BMD:   08/16/17 osteopenia  TDaP:  2015 Pneumonia vaccine(s):  n/a Shingrix:   No.  Reviewed with pt Hep C testing: 07/08/17 neg  Screening Labs: PCP   reports that she has never smoked. She has never used smokeless tobacco. She reports current alcohol use. She reports that she does not use drugs.  Past Medical History:  Diagnosis Date  . Abnormal Pap smear of cervix   . Headache(784.0)    menstrual migraines  . HPV (human papilloma virus) infection    condyloma    Past Surgical History:  Procedure Laterality Date  . GYNECOLOGIC CRYOSURGERY  1984  . HYSTEROSCOPY W/D&C  2006  . LEEP  1996   CIN [place]  7/07  . RECTAL POLYPECTOMY  2001   anal polyp  . SKIN BIOPSY     area froze off-left shoulder  . VARICOSE VEIN SURGERY      Current Outpatient Medications  Medication Sig Dispense Refill  . cetirizine (ZYRTEC) 10 MG tablet Take 10 mg by mouth daily. As needed    . cholecalciferol (VITAMIN D) 1000 units tablet Take 1,000 Units by mouth daily.    Marland Kitchen estradiol (ESTRACE) 0.5 MG tablet Take 1 tablet (0.5 mg total) by mouth daily. (Patient taking differently: Take 0.25 mg by mouth daily. ) 30 tablet 0  . fluticasone (FLONASE) 50 MCG/ACT nasal  spray Place 2 sprays into the nose as needed.     Marland Kitchen MAGNESIUM PO Take by mouth daily.     . medroxyPROGESTERone (PROVERA) 5 MG tablet TAKE 1 TABLET BY MOUTH ON DAYS 1-15 OF EACH MONTH (Patient taking differently: Take 0.25 mg by mouth daily. TAKE 1/2 TABLET BY MOUTH ON DAYS 1-15 OF EACH MONTH) 45 tablet 3  . Multiple Vitamins-Minerals (MULTIVITAMIN PO) Take by mouth.    . polyethylene glycol (MIRALAX / GLYCOLAX) packet Take 17 g by mouth daily.    . rizatriptan (MAXALT) 10 MG tablet TAKE 1 TABLET (10 MG TOTAL) BY MOUTH AS NEEDED FOR MIGRAINE. MAY REPEAT IN 2 HOURS IF NEEDED 9 tablet 10   No current facility-administered medications for this visit.     Family History  Problem Relation Age of Onset  . Cancer Maternal Grandmother        endometrial  . Breast cancer Mother   . Hypertension Mother   . Coronary artery disease Mother   . Bipolar disorder Mother   . Osteoporosis Mother   . Pancreatic cancer Maternal Grandfather   . Hypertension Father   . Coronary artery disease Father   . Melanoma Father   . Hypertension  Other        all grandparents  . Bipolar disorder Brother     Review of Systems  Genitourinary:       Vaginal dryness   All other systems reviewed and are negative.   Exam:   BP 126/82   Pulse 60   Temp 97.7 F (36.5 C) (Temporal)   Ht 5' 8.25" (1.734 m)   Wt 152 lb (68.9 kg)   LMP 07/04/2017   BMI 22.94 kg/m   Height: 5' 8.25" (173.4 cm)  Ht Readings from Last 3 Encounters:  11/13/18 5' 8.25" (1.734 m)  09/07/18 5' 8.75" (1.746 m)  02/09/18 5' 8.75" (1.746 m)    General appearance: alert, cooperative and appears stated age Head: Normocephalic, without obvious abnormality, atraumatic Neck: no adenopathy, supple, symmetrical, trachea midline and thyroid normal to inspection and palpation Lungs: clear to auscultation bilaterally Breasts: normal appearance, no masses or tenderness Heart: regular rate and rhythm Abdomen: soft, non-tender; bowel sounds  normal; no masses,  no organomegaly Extremities: extremities normal, atraumatic, no cyanosis or edema Skin: Skin color, texture, turgor normal. No rashes or lesions Lymph nodes: Cervical, supraclavicular, and axillary nodes normal. No abnormal inguinal nodes palpated Neurologic: Grossly normal   Pelvic: External genitalia:  no lesions              Urethra:  normal appearing urethra with no masses, tenderness or lesions              Bartholins and Skenes: normal                 Vagina: normal appearing vagina with normal color and discharge, no lesions              Cervix: no lesions              Pap taken: No. Bimanual Exam:  Uterus:  normal size, contour, position, consistency, mobility, non-tender              Adnexa: normal adnexa and no mass, fullness, tenderness               Rectovaginal: Confirms               Anus:  normal sphincter tone, no lesions  Chaperone was present for exam.  A:  Well Woman with normal exam PMP, on HRT H/o abnormal pap smear with cryo and then LEEP 1996.  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  Pt is going to call to schedule. pap smear with neg HR HPV 2018.  Not indicated today maxalt RF to pharmacy Blood work UTD with Dr. Kathyrn Lass and in [facility] She is going to try and taper her HR with estradiol 0.5mg  (1/2 tab every other day) and Provera 2.5mg  (1/2 tab every other day) Trial of vaginal Vit E suppositories 200u/ml, one pv three times weekly to [place]. Return annually or prn

## 2018-11-13 NOTE — Telephone Encounter (Signed)
Patient just left office from visit with Dr Sabra Heck this morning and wanted to ask if Gabapentin was being prescribed. Please advise.

## 2018-11-13 NOTE — Telephone Encounter (Signed)
Routed to Dr. Miller

## 2018-11-14 MED FILL — GABAPENTIN 100 MG CAPSULE: 100 | 30 days supply | Qty: 60 | Fill #0

## 2018-11-14 NOTE — Telephone Encounter (Signed)
Erroneous encounter

## 2018-11-20 DIAGNOSIS — H5213 Myopia, bilateral: Secondary | ICD-10-CM | POA: Diagnosis not present

## 2018-11-24 ENCOUNTER — Other Ambulatory Visit: Payer: Self-pay

## 2018-11-24 ENCOUNTER — Ambulatory Visit
Admission: RE | Admit: 2018-11-24 | Discharge: 2018-11-24 | Disposition: A | Discharge status: Home / Self Care | Payer: 59 | Source: Ambulatory Visit | Attending: Family Medicine | Admitting: Family Medicine

## 2018-11-24 DIAGNOSIS — Z1231 Encounter for screening mammogram for malignant neoplasm of breast: Secondary | ICD-10-CM

## 2019-01-01 MED FILL — GABAPENTIN 100 MG CAPSULE: 100 | 20 days supply | Qty: 60 | Fill #1

## 2019-01-05 ENCOUNTER — Ambulatory Visit: Payer: 59 | Admitting: Family Medicine

## 2019-02-08 ENCOUNTER — Ambulatory Visit: Payer: 59 | Admitting: Obstetrics & Gynecology

## 2019-02-16 MED FILL — RIZATRIPTAN BENZOATE 10 MG: 10 | 15 days supply | Qty: 9 | Fill #1

## 2019-04-06 ENCOUNTER — Encounter: Payer: Self-pay | Admitting: Obstetrics & Gynecology

## 2019-04-06 ENCOUNTER — Other Ambulatory Visit: Payer: Self-pay | Admitting: Obstetrics & Gynecology

## 2019-04-06 NOTE — Telephone Encounter (Signed)
Medication refill request: gabapentin Last AEX:  11-13-2018 Next AEX: 01-31-2020 Last MMG (if hormonal medication request): n/a Refill authorized: please approve if appropriate

## 2019-04-09 NOTE — Telephone Encounter (Signed)
Can you please call pt to see what dosage she is using so I can take care of the refill with the correct dosage of medication. Thanks.

## 2019-04-09 NOTE — Telephone Encounter (Signed)
I spoke with patient & she said she replied back to you with a mychart message regarding medication. Please review.

## 2019-04-10 MED FILL — GABAPENTIN 100 MG CAPSULE: 100 | 30 days supply | Qty: 60 | Fill #0

## 2019-04-10 NOTE — Telephone Encounter (Signed)
Patient notified

## 2019-04-18 DIAGNOSIS — H04123 Dry eye syndrome of bilateral lacrimal glands: Secondary | ICD-10-CM | POA: Diagnosis not present

## 2019-04-23 ENCOUNTER — Encounter: Payer: Self-pay | Admitting: Obstetrics & Gynecology

## 2019-04-24 ENCOUNTER — Other Ambulatory Visit: Payer: Self-pay | Admitting: Obstetrics & Gynecology

## 2019-04-24 MED ORDER — ESTRADIOL 0.1 MG/GM VA CREA: TOPICAL_CREAM | VAGINAL | 1 refills | Status: DC

## 2019-04-24 MED FILL — ESTRADIOL 0.1 MG/GM CRM: 0.1 | 90 days supply | Qty: 43 | Fill #0

## 2019-04-24 NOTE — Progress Notes (Signed)
Estrogen vaginal cream prescription sent to [facility] pharmacy as requested by pt in [employer].

## 2019-07-24 DIAGNOSIS — Z6822 Body mass index (BMI) 22.0-22.9, adult: Secondary | ICD-10-CM | POA: Diagnosis not present

## 2019-07-24 DIAGNOSIS — M8580 Other specified disorders of bone density and structure, unspecified site: Secondary | ICD-10-CM | POA: Diagnosis not present

## 2019-07-24 DIAGNOSIS — J309 Allergic rhinitis, unspecified: Secondary | ICD-10-CM | POA: Diagnosis not present

## 2019-07-24 DIAGNOSIS — Z86010 Personal history of colonic polyps: Secondary | ICD-10-CM | POA: Diagnosis not present

## 2019-07-24 DIAGNOSIS — G43909 Migraine, unspecified, not intractable, without status migrainosus: Secondary | ICD-10-CM | POA: Diagnosis not present

## 2019-07-24 DIAGNOSIS — N951 Menopausal and female climacteric states: Secondary | ICD-10-CM | POA: Diagnosis not present

## 2019-07-24 DIAGNOSIS — Z0000 Encounter for general adult medical examination without abnormal findings: Secondary | ICD-10-CM | POA: Diagnosis not present

## 2019-07-24 DIAGNOSIS — K5900 Constipation, unspecified: Secondary | ICD-10-CM | POA: Diagnosis not present

## 2019-07-25 ENCOUNTER — Ambulatory Visit: Payer: 59 | Admitting: Psychology

## 2019-09-06 ENCOUNTER — Ambulatory Visit (INDEPENDENT_AMBULATORY_CARE_PROVIDER_SITE_OTHER): Payer: 59 | Admitting: Psychology

## 2019-09-06 DIAGNOSIS — F4323 Adjustment disorder with mixed anxiety and depressed mood: Secondary | ICD-10-CM

## 2019-09-17 MED FILL — GABAPENTIN 100 MG CAP: 100 | 30 days supply | Qty: 60 | Fill #1

## 2019-09-20 ENCOUNTER — Ambulatory Visit (INDEPENDENT_AMBULATORY_CARE_PROVIDER_SITE_OTHER): Payer: 59 | Admitting: Psychology

## 2019-09-20 DIAGNOSIS — F4323 Adjustment disorder with mixed anxiety and depressed mood: Secondary | ICD-10-CM

## 2019-10-19 ENCOUNTER — Ambulatory Visit: Payer: 59 | Admitting: Psychology

## 2019-10-25 ENCOUNTER — Ambulatory Visit (INDEPENDENT_AMBULATORY_CARE_PROVIDER_SITE_OTHER): Payer: 59 | Admitting: Psychology

## 2019-10-25 DIAGNOSIS — F4323 Adjustment disorder with mixed anxiety and depressed mood: Secondary | ICD-10-CM | POA: Diagnosis not present

## 2019-10-30 ENCOUNTER — Ambulatory Visit: Payer: 59 | Admitting: Psychology

## 2019-11-13 ENCOUNTER — Ambulatory Visit: Payer: 59 | Admitting: Psychology

## 2019-11-29 ENCOUNTER — Other Ambulatory Visit: Payer: Self-pay | Admitting: Family Medicine

## 2019-11-29 DIAGNOSIS — Z1231 Encounter for screening mammogram for malignant neoplasm of breast: Secondary | ICD-10-CM

## 2019-12-19 ENCOUNTER — Telehealth: Payer: Self-pay

## 2019-12-19 NOTE — Telephone Encounter (Signed)
Left message to call Sharee Pimple, RN at [facility].   Last AEX 11/13/18

## 2019-12-19 NOTE — Telephone Encounter (Signed)
Patient is experiencing painful intercourse.

## 2019-12-21 NOTE — Telephone Encounter (Signed)
AEX 10/2018 PMP, no HRT H/o vaginal dryness   Spoke with pt. Pt states having pain with intercourse that has been ongoing for months and would like to see Dr Sabra Heck about it. Pt off HRT x 1 year. Pt denies any vaginal bleeding or spotting with painful intercourse. Pt states feels "stiff".  Pt advised to have OV for further evaluation. Pt agreeable. Pt scheduled with Dr Sabra Heck on 11/5 at 4 pm. Pt agreeable to date and time of appt. Pt cancelled AEX scheduled in 12/21 and declines making new appt at this time. Pt states will be following Dr Sabra Heck to [facility].  Encounter closed

## 2019-12-27 NOTE — Progress Notes (Signed)
GYNECOLOGY  VISIT  CC:   Patient c/o having pain with intercourse with "light bleeding" afterwards.    HPI: 54 y.o. G75P1001 Married White or Caucasian female here for ongoing pain with intercourse.  She was on HRT but has fully stopped this.  Just doesn't want the associated risks.  Has also used vaginal estrogen cream and she feels this really did help but doesn't like the applicator and cream.  Intercourse feels tight with significant dryness and pain.  Lubrication really doesn't Did have a little pink after intercourse recently but was so painful and dry.  Options for treatment discussed.    GYNECOLOGIC HISTORY: Patient's last menstrual period was 07/04/2017. Contraception: OCP Menopausal hormone therapy: NA  Patient Active Problem List   Diagnosis Date Noted  . Cognitive changes 07/14/2015  . Migraine 07/14/2015  . SUI (stress urinary incontinence, female) 12/17/2014    Past Medical History:  Diagnosis Date  . Abnormal Pap smear of cervix   . Headache(784.0)    menstrual migraines  . HPV (human papilloma virus) infection    condyloma    Past Surgical History:  Procedure Laterality Date  . GYNECOLOGIC CRYOSURGERY  1984  . HYSTEROSCOPY WITH D & C  2006  . LEEP  1996   CIN [place]  7/07  . RECTAL POLYPECTOMY  2001   anal polyp  . SKIN BIOPSY     area froze off-left shoulder  . VARICOSE VEIN SURGERY      MEDS:   Current Outpatient Medications on File Prior to Visit  Medication Sig Dispense Refill  . cetirizine (ZYRTEC) 10 MG tablet Take 10 mg by mouth daily. As needed    . cholecalciferol (VITAMIN D) 1000 units tablet Take 1,000 Units by mouth daily.    . fluticasone (FLONASE) 50 MCG/ACT nasal spray Place 2 sprays into the nose as needed.     Marland Kitchen MAGNESIUM PO Take by mouth daily.     . polyethylene glycol (MIRALAX / GLYCOLAX) packet Take 17 g by mouth daily.    Marland Kitchen estradiol (ESTRACE) 0.1 MG/GM vaginal cream 1 gram vaginally twice weekly (Patient not  taking: Reported on 12/28/2019) 42.5 g 1  . estradiol (ESTRACE) 0.5 MG tablet Take 1 tablet (0.5 mg total) by mouth daily. (Patient not taking: Reported on 12/28/2019) 30 tablet 5  . rizatriptan (MAXALT) 10 MG tablet Take 1 tablet (10 mg total) by mouth as needed for migraine. May repeat in 2 hours if needed (Patient not taking: Reported on 12/28/2019) 9 tablet 3   No current facility-administered medications on file prior to visit.    ALLERGIES: Patient has no known allergies.  Family History  Problem Relation Age of Onset  . Endometrial cancer Maternal Grandmother           . Hypertension Mother   . Coronary artery disease Mother   . Bipolar disorder Mother   . Osteoporosis Mother   . Pancreatic cancer Maternal Grandfather   . Hypertension Father   . Coronary artery disease Father   . Melanoma Father   . Hypertension Other        all grandparents  . Bipolar disorder Brother     SH:  Married, non smoker  Review of Systems  Genitourinary:       Pain with intercourse  All other systems reviewed and are negative.   PHYSICAL EXAMINATION:    BP (!) 110/58 (BP Location: Right Arm, Patient Position: Sitting, Cuff Size: Normal)   Pulse 72  Resp 16   Ht 5' 8.25" (1.734 m)   Wt 151 lb (68.5 kg)   LMP 07/04/2017   BMI 22.79 kg/m     General appearance: alert, cooperative and appears stated age Lymph:  no inguinal LAD noted  Pelvic: External genitalia:  no lesions              Urethra:  normal appearing urethra with no masses, tenderness or lesions              Bartholins and Skenes: normal                 Vagina: significant atrophic changes, no visible lesions, no blood         Cervix: no lesions and atrophic changes even noted on cervix              Bimanual Exam:  Uterus:  normal size, contour, position, consistency, mobility, non-tender              Adnexa: no mass, fullness, tenderness             Chaperone, Terence Lux, CMA, was present for  exam.  Assessment: Significant vaginal atrophic changes Dyspareunia with pink spotting after most recent episode of intercourse  Plan: Options discussed with pt.  She does not want to be on transdermal or oral HRT.  She does not want to use vaginal estrogen cream if there are other options.  Vagifem, ring, osphena options still exist as well as non-FDA approved [employer] touch.  I think osphena is reasonable to try at this point.  Daily dosing for at least three months discussed.  Risks/benefits reviewed including cardiovascular risks and breast cancer.  Rx for osphena 60mg  daily dosing to pharmacy.  #30/2RF.   22 minutes of total time was spent for this patient encounter, including preparation, face-to-face counseling with the patient and coordination of care, and documentation of the encounter.

## 2019-12-28 ENCOUNTER — Ambulatory Visit (INDEPENDENT_AMBULATORY_CARE_PROVIDER_SITE_OTHER): Payer: 59 | Admitting: Obstetrics & Gynecology

## 2019-12-28 ENCOUNTER — Encounter: Payer: Self-pay | Admitting: Obstetrics & Gynecology

## 2019-12-28 ENCOUNTER — Other Ambulatory Visit: Payer: Self-pay | Admitting: Obstetrics & Gynecology

## 2019-12-28 ENCOUNTER — Other Ambulatory Visit: Payer: Self-pay

## 2019-12-28 VITALS — BP 110/58 | HR 72 | Resp 16 | Ht 68.25 in | Wt 151.0 lb

## 2019-12-28 DIAGNOSIS — N941 Unspecified dyspareunia: Secondary | ICD-10-CM | POA: Diagnosis not present

## 2019-12-28 DIAGNOSIS — N952 Postmenopausal atrophic vaginitis: Secondary | ICD-10-CM

## 2019-12-28 MED ORDER — OSPEMIFENE 60 MG PO TABS: 1.0000 | ORAL_TABLET | Freq: Every day | ORAL | 3 refills | Status: DC

## 2019-12-28 MED FILL — OSPHENA 60 MG TABLET: 60 | 30 days supply | Qty: 30 | Fill #0

## 2020-01-07 ENCOUNTER — Ambulatory Visit (INDEPENDENT_AMBULATORY_CARE_PROVIDER_SITE_OTHER): Payer: 59 | Admitting: Psychology

## 2020-01-07 DIAGNOSIS — F4323 Adjustment disorder with mixed anxiety and depressed mood: Secondary | ICD-10-CM | POA: Diagnosis not present

## 2020-01-09 ENCOUNTER — Other Ambulatory Visit: Payer: Self-pay | Admitting: Obstetrics and Gynecology

## 2020-01-09 ENCOUNTER — Telehealth: Payer: Self-pay

## 2020-01-09 MED ORDER — ESTRADIOL 0.1 MG/GM VA CREA: TOPICAL_CREAM | VAGINAL | 0 refills | Status: DC

## 2020-01-09 MED FILL — ESTRADIOL 0.1 MG/GM CREA: 0.1 | 90 days supply | Qty: 43 | Fill #0

## 2020-01-09 NOTE — Telephone Encounter (Signed)
Patient has questions regarding a new medication she is taking.

## 2020-01-09 NOTE — Telephone Encounter (Signed)
Patient returned call. Patient states Dr. Sabra Heck started her on [place] for dyspareunia following her visit on 12-28-19. Patient states that since starting the Osphena, she has seen an increase in her "hormonal fluctuations." States, "I walk around on the verge of tears." Patient asking if this could be related to [place]? States she is not opposed to restarting the vaginal estrogen cream she was on before. States she stopped due to not wanting to use the same applicator for 90 days, but is now aware she can purchase applicators on [employer]. Asking for MD recommendations. RN would send to provider for review and return call with recommendations. Patient agreeable. Patient states she does have about 1/3 of a tube of vaginal estrogen cream at home.   Routing to provider for review.

## 2020-01-09 NOTE — Telephone Encounter (Signed)
Left message for pt to return call to triage RN. 

## 2020-01-09 NOTE — Telephone Encounter (Signed)
Call to patient. Message given to patient as seen below from Dr. Talbert Nan and patient verbalized understanding. Patient scheduled for aex on 02-13-20 at 0800 with Dr. Quincy Simmonds. Patient agreeable to date and time of appointment. Patient requesting refill of Estrace be sent to [facility]. MMG scheduled for 01-28-20 at [facility].    Estrace #42.5g, 0RF sent to confirmed pharmacy.   Routing to provider and will close encounter.

## 2020-01-09 NOTE — Telephone Encounter (Signed)
She can absolutely restart her estrogen vaginal cream. She is due for an annual. I see her mammogram is scheduled in 12/21. She can have one refill of the estrogen cream to give her time to get in for an annual exam.

## 2020-01-15 NOTE — Telephone Encounter (Signed)
No message needed °

## 2020-01-28 ENCOUNTER — Ambulatory Visit
Admission: RE | Admit: 2020-01-28 | Discharge: 2020-01-28 | Disposition: A | Discharge status: Home / Self Care | Payer: 59 | Source: Ambulatory Visit | Attending: Family Medicine | Admitting: Family Medicine

## 2020-01-28 ENCOUNTER — Other Ambulatory Visit: Payer: Self-pay

## 2020-01-28 DIAGNOSIS — Z1231 Encounter for screening mammogram for malignant neoplasm of breast: Secondary | ICD-10-CM | POA: Diagnosis not present

## 2020-01-29 ENCOUNTER — Other Ambulatory Visit (HOSPITAL_COMMUNITY): Payer: Self-pay | Admitting: Family Medicine

## 2020-01-29 DIAGNOSIS — F4323 Adjustment disorder with mixed anxiety and depressed mood: Secondary | ICD-10-CM | POA: Diagnosis not present

## 2020-01-29 MED FILL — VENLAFAXINE HCL ER 37.5 MG: 37.5 | 30 days supply | Qty: 30 | Fill #0

## 2020-01-30 ENCOUNTER — Other Ambulatory Visit: Payer: Self-pay | Admitting: Family Medicine

## 2020-01-30 DIAGNOSIS — R928 Other abnormal and inconclusive findings on diagnostic imaging of breast: Secondary | ICD-10-CM

## 2020-01-31 ENCOUNTER — Ambulatory Visit: Payer: 59

## 2020-02-01 ENCOUNTER — Ambulatory Visit
Admission: RE | Admit: 2020-02-01 | Discharge: 2020-02-01 | Disposition: A | Discharge status: Home / Self Care | Payer: 59 | Source: Ambulatory Visit | Attending: Family Medicine | Admitting: Family Medicine

## 2020-02-01 ENCOUNTER — Ambulatory Visit: Payer: 59

## 2020-02-01 ENCOUNTER — Other Ambulatory Visit: Payer: Self-pay

## 2020-02-01 DIAGNOSIS — R928 Other abnormal and inconclusive findings on diagnostic imaging of breast: Secondary | ICD-10-CM

## 2020-02-07 ENCOUNTER — Other Ambulatory Visit: Payer: 59

## 2020-02-13 ENCOUNTER — Ambulatory Visit: Payer: 59 | Admitting: Obstetrics and Gynecology

## 2020-02-28 ENCOUNTER — Other Ambulatory Visit (HOSPITAL_COMMUNITY): Payer: Self-pay | Admitting: Family Medicine

## 2020-02-28 MED FILL — VENLAFAXINE HCL ER 37.5 MG: 37.5 | 30 days supply | Qty: 30 | Fill #0

## 2020-03-11 ENCOUNTER — Other Ambulatory Visit: Payer: Self-pay | Admitting: Gastroenterology

## 2020-03-11 DIAGNOSIS — R1310 Dysphagia, unspecified: Secondary | ICD-10-CM

## 2020-03-17 DIAGNOSIS — Z20822 Contact with and (suspected) exposure to covid-19: Secondary | ICD-10-CM | POA: Diagnosis not present

## 2020-03-27 ENCOUNTER — Ambulatory Visit (HOSPITAL_COMMUNITY): Admission: RE | Admit: 2020-03-27 | Payer: 59 | Source: Ambulatory Visit

## 2020-03-27 MED FILL — VENLAFAXINE HCL ER 37.5 MG: 37.5 | 30 days supply | Qty: 30 | Fill #1

## 2020-04-04 ENCOUNTER — Ambulatory Visit (HOSPITAL_COMMUNITY)
Admission: RE | Admit: 2020-04-04 | Discharge: 2020-04-04 | Disposition: A | Discharge status: Home / Self Care | Payer: 59 | Source: Ambulatory Visit | Attending: Gastroenterology | Admitting: Gastroenterology

## 2020-04-04 ENCOUNTER — Other Ambulatory Visit: Payer: Self-pay

## 2020-04-04 DIAGNOSIS — R1310 Dysphagia, unspecified: Secondary | ICD-10-CM | POA: Insufficient documentation

## 2020-04-16 DIAGNOSIS — E559 Vitamin D deficiency, unspecified: Secondary | ICD-10-CM | POA: Diagnosis not present

## 2020-04-16 DIAGNOSIS — M25561 Pain in right knee: Secondary | ICD-10-CM | POA: Diagnosis not present

## 2020-04-16 DIAGNOSIS — M25522 Pain in left elbow: Secondary | ICD-10-CM | POA: Diagnosis not present

## 2020-04-16 DIAGNOSIS — M25562 Pain in left knee: Secondary | ICD-10-CM | POA: Diagnosis not present

## 2020-04-16 DIAGNOSIS — M79645 Pain in left finger(s): Secondary | ICD-10-CM | POA: Diagnosis not present

## 2020-04-16 DIAGNOSIS — R5383 Other fatigue: Secondary | ICD-10-CM | POA: Diagnosis not present

## 2020-04-16 DIAGNOSIS — M79644 Pain in right finger(s): Secondary | ICD-10-CM | POA: Diagnosis not present

## 2020-04-16 DIAGNOSIS — M25521 Pain in right elbow: Secondary | ICD-10-CM | POA: Diagnosis not present

## 2020-04-17 DIAGNOSIS — R5383 Other fatigue: Secondary | ICD-10-CM | POA: Diagnosis not present

## 2020-04-17 DIAGNOSIS — M79644 Pain in right finger(s): Secondary | ICD-10-CM | POA: Diagnosis not present

## 2020-04-17 DIAGNOSIS — M25561 Pain in right knee: Secondary | ICD-10-CM | POA: Diagnosis not present

## 2020-04-17 DIAGNOSIS — E559 Vitamin D deficiency, unspecified: Secondary | ICD-10-CM | POA: Diagnosis not present

## 2020-04-17 DIAGNOSIS — M25522 Pain in left elbow: Secondary | ICD-10-CM | POA: Diagnosis not present

## 2020-04-17 DIAGNOSIS — M25562 Pain in left knee: Secondary | ICD-10-CM | POA: Diagnosis not present

## 2020-04-17 DIAGNOSIS — M79645 Pain in left finger(s): Secondary | ICD-10-CM | POA: Diagnosis not present

## 2020-04-17 DIAGNOSIS — M25521 Pain in right elbow: Secondary | ICD-10-CM | POA: Diagnosis not present

## 2020-04-23 MED FILL — VENLAFAXINE HCL ER 37.5 MG: 37.5 | 30 days supply | Qty: 30 | Fill #2

## 2020-04-25 DIAGNOSIS — M25562 Pain in left knee: Secondary | ICD-10-CM | POA: Diagnosis not present

## 2020-04-25 DIAGNOSIS — M25561 Pain in right knee: Secondary | ICD-10-CM | POA: Diagnosis not present

## 2020-04-28 DIAGNOSIS — M25562 Pain in left knee: Secondary | ICD-10-CM | POA: Diagnosis not present

## 2020-05-01 DIAGNOSIS — Z20822 Contact with and (suspected) exposure to covid-19: Secondary | ICD-10-CM | POA: Diagnosis not present

## 2020-05-02 DIAGNOSIS — M25562 Pain in left knee: Secondary | ICD-10-CM | POA: Diagnosis not present

## 2020-05-08 ENCOUNTER — Other Ambulatory Visit: Payer: Self-pay

## 2020-05-08 ENCOUNTER — Encounter (HOSPITAL_BASED_OUTPATIENT_CLINIC_OR_DEPARTMENT_OTHER): Payer: Self-pay | Admitting: Orthopaedic Surgery

## 2020-05-12 ENCOUNTER — Other Ambulatory Visit (HOSPITAL_COMMUNITY)
Admission: RE | Admit: 2020-05-12 | Discharge: 2020-05-12 | Disposition: A | Discharge status: Home / Self Care | Payer: 59 | Source: Ambulatory Visit | Attending: Orthopaedic Surgery | Admitting: Orthopaedic Surgery

## 2020-05-12 DIAGNOSIS — Z20822 Contact with and (suspected) exposure to covid-19: Secondary | ICD-10-CM | POA: Diagnosis not present

## 2020-05-12 DIAGNOSIS — Z01812 Encounter for preprocedural laboratory examination: Secondary | ICD-10-CM | POA: Insufficient documentation

## 2020-05-12 NOTE — Progress Notes (Signed)

## 2020-05-13 ENCOUNTER — Other Ambulatory Visit (HOSPITAL_BASED_OUTPATIENT_CLINIC_OR_DEPARTMENT_OTHER): Payer: Self-pay

## 2020-05-13 DIAGNOSIS — H04123 Dry eye syndrome of bilateral lacrimal glands: Secondary | ICD-10-CM | POA: Diagnosis not present

## 2020-05-13 DIAGNOSIS — H4311 Vitreous hemorrhage, right eye: Secondary | ICD-10-CM | POA: Diagnosis not present

## 2020-05-13 DIAGNOSIS — H43811 Vitreous degeneration, right eye: Secondary | ICD-10-CM | POA: Diagnosis not present

## 2020-05-13 LAB — SARS CORONAVIRUS 2 (TAT 6-24 HRS): SARS Coronavirus 2: NEGATIVE

## 2020-05-14 NOTE — H&P (Signed)
PREOPERATIVE H&P  Chief Complaint: LEFT KNEE CHONDROMALACIA PATELLAE, LATERAL MENISCUS TEAR RIGHT ELBOW LATERAL EPICONDYLITIS  HPI: Taylor Townsend is a 55 y.o. female who is scheduled for, Procedure(s): KNEE ARTHROSCOPY WITH DEBRIDEMENT (CHONDROPLASTY) AND  LATERAL MENISECTOMY INJECTION ASPIRATION ARTHROCENTESIS INTERMEDULLARY JOINT BURSA.   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  She was coming in primarily for left knee pain that has been bothering her over the last few years.  However, over the past two weeks she feels like her knee is giving way.  She has a loose feeling.  It is 10/10 pain when she is rolling over in bed.  She has pain with walking and any activities.  She has difficulty going from sitting to standing and vice versa.  She has had no traumatic injury to the left knee.  She also reports some pain over the medial epicondyle of the right elbow.  She is tender in that area and hasn't had any treatment for that.    Her symptoms are rated as moderate to severe, and have been worsening.  This is significantly impairing activities of daily living.    Please see clinic note for further details on this patient's care.    She has elected for surgical management.   Past Medical History:  Diagnosis Date  . Abnormal Pap smear of cervix   . Anxiety   . Depression   . Family history of adverse reaction to anesthesia    grandmother went into cardiac arrest  . Headache(784.0)    menstrual migraines  . HPV (human papilloma virus) infection    condyloma   Past Surgical History:  Procedure Laterality Date  . GYNECOLOGIC CRYOSURGERY  1984  . HYSTEROSCOPY WITH D & C  2006  . LEEP  1996   CIN [place]  7/07  . RECTAL POLYPECTOMY  2001   anal polyp  . SKIN BIOPSY     area froze off-left shoulder  . VARICOSE VEIN SURGERY    . WISDOM TOOTH EXTRACTION     Social History   Socioeconomic History  . Marital status: Married     Spouse name: Not on file  . Number of children: 1  . Years of education: 68+  . Highest education level: Not on file  Occupational History  . Occupation: Pa- [employer]  Tobacco Use  . Smoking status: Never Smoker  . Smokeless tobacco: Never Used  Vaping Use  . Vaping Use: Never used  Substance and Sexual Activity  . Alcohol use: Yes    Alcohol/week: 0.0 - 1.0 standard drinks  . Drug use: No  . Sexual activity: Yes    Partners: Male    Birth control/protection: Other-see comments    Comment: vasectomy  Other Topics Concern  . Not on file  Social History Narrative   Lives w/ husband   Caffeine use: Drinks 4-5 cups per day   Social Determinants of Health   Financial Resource Strain: Not on file  Food Insecurity: Not on file  Transportation Needs: Not on file  Physical Activity: Not on file  Stress: Not on file  Social Connections: Not on file   Family History  Problem Relation Age of Onset  . Endometrial cancer Maternal Grandmother           . Hypertension Mother   . Coronary artery disease Mother   . Bipolar disorder Mother   . Osteoporosis Mother   . Pancreatic cancer  Maternal Grandfather   . Hypertension Father   . Coronary artery disease Father   . Melanoma Father   . Hypertension Other        all grandparents  . Bipolar disorder Brother    No Known Allergies Prior to Admission medications   Medication Sig Start Date End Date Taking? Authorizing Provider  cetirizine (ZYRTEC) 10 MG tablet Take 10 mg by mouth daily. As needed   Yes [provider]  cholecalciferol (VITAMIN D) 1000 units tablet Take 1,000 Units by mouth daily.   Yes [provider]  estradiol (ESTRACE) 0.1 MG/GM vaginal cream 1 gram vaginally twice weekly 01/09/20  Yes Salvadore Dom, MD  fluticasone [facility]) 50 MCG/ACT nasal spray Place 2 sprays into the nose as needed.    Yes [provider]  MAGNESIUM PO Take by mouth daily.    Yes [provider]   polyethylene glycol (MIRALAX / GLYCOLAX) packet Take 17 g by mouth daily.   Yes [provider]  venlafaxine (EFFEXOR) 37.5 MG tablet Take 37.5 mg by mouth 2 (two) times daily.   Yes [provider]  rizatriptan (MAXALT) 10 MG tablet Take 1 tablet (10 mg total) by mouth as needed for migraine. May repeat in 2 hours if needed Patient not taking: No sig reported 11/13/18   Megan Salon, MD    ROS: All other systems have been reviewed and were otherwise negative with the exception of those mentioned in the HPI and as above.  Physical Exam: General: Alert, no acute distress Cardiovascular: No pedal edema Respiratory: No cyanosis, no use of accessory musculature GI: No organomegaly, abdomen is soft and non-tender Skin: No lesions in the area of chief complaint Neurologic: Sensation intact distally Psychiatric: Patient is competent for consent with normal mood and affect Lymphatic: No axillary or cervical lymphadenopathy  MUSCULOSKELETAL:  The left knee demonstrates tenderness to palpation in the lateral knee exquisitely; mild tenderness on the medial side as well.  No obvious effusion today.  She has got full range of motion of her right elbow.  She is tender to palpation in the medial epicondyle.  No pain with resisted wrist flexion or tight grip.  Imaging: MRI of the left knee reviewed demonstrated some mild degenerative changes throughout, but she also has a complex anterolateral meniscus tear.    Assessment: - Anterolateral meniscus tear with some mild degenerative changes in the lateral and patellofemoral compartments - right medial epicondylitis-type symptoms.  Plan: Plan for Procedure(s): KNEE ARTHROSCOPY WITH DEBRIDEMENT (CHONDROPLASTY) AND  LATERAL MENISECTOMY INJECTION ASPIRATION ARTHROCENTESIS INTERMEDULLARY JOINT BURSA  The risks benefits and alternatives were discussed with the patient including but not limited to the risks of nonoperative treatment,  versus surgical intervention including infection, bleeding, nerve injury,  blood clots, cardiopulmonary complications, morbidity, mortality, among others, and they were willing to proceed.   The patient acknowledged the explanation, agreed to proceed with the plan and consent was signed.   Operative Plan: Left knee scope with meniscectomy and chondroplasty with right medial epicondylitis injection Discharge Medications: Standard DVT Prophylaxis: Aspirin Physical Therapy: +/- Special Discharge needs: +/-   Ethelda Chick, PA-C  05/14/2020 4:39 PM

## 2020-05-15 ENCOUNTER — Other Ambulatory Visit: Payer: Self-pay

## 2020-05-15 ENCOUNTER — Encounter (HOSPITAL_BASED_OUTPATIENT_CLINIC_OR_DEPARTMENT_OTHER): Payer: Self-pay | Admitting: Orthopaedic Surgery

## 2020-05-15 ENCOUNTER — Ambulatory Visit (HOSPITAL_BASED_OUTPATIENT_CLINIC_OR_DEPARTMENT_OTHER): Payer: 59 | Admitting: Anesthesiology

## 2020-05-15 ENCOUNTER — Ambulatory Visit (HOSPITAL_BASED_OUTPATIENT_CLINIC_OR_DEPARTMENT_OTHER)
Admission: RE | Admit: 2020-05-15 | Discharge: 2020-05-15 | Disposition: A | Discharge status: Home / Self Care | Payer: 59 | Attending: Orthopaedic Surgery | Admitting: Orthopaedic Surgery

## 2020-05-15 ENCOUNTER — Encounter (HOSPITAL_BASED_OUTPATIENT_CLINIC_OR_DEPARTMENT_OTHER)
Admission: RE | Disposition: A | Discharge status: Home / Self Care | Payer: Self-pay | Source: Home / Self Care | Attending: Orthopaedic Surgery

## 2020-05-15 ENCOUNTER — Other Ambulatory Visit (HOSPITAL_COMMUNITY): Payer: Self-pay | Admitting: Physician Assistant

## 2020-05-15 DIAGNOSIS — M2242 Chondromalacia patellae, left knee: Secondary | ICD-10-CM | POA: Diagnosis not present

## 2020-05-15 DIAGNOSIS — Z79899 Other long term (current) drug therapy: Secondary | ICD-10-CM | POA: Diagnosis not present

## 2020-05-15 DIAGNOSIS — S83282A Other tear of lateral meniscus, current injury, left knee, initial encounter: Secondary | ICD-10-CM | POA: Diagnosis not present

## 2020-05-15 DIAGNOSIS — Z79890 Hormone replacement therapy (postmenopausal): Secondary | ICD-10-CM | POA: Diagnosis not present

## 2020-05-15 DIAGNOSIS — M7711 Lateral epicondylitis, right elbow: Secondary | ICD-10-CM | POA: Diagnosis not present

## 2020-05-15 DIAGNOSIS — M23201 Derangement of unspecified lateral meniscus due to old tear or injury, left knee: Secondary | ICD-10-CM | POA: Diagnosis not present

## 2020-05-15 DIAGNOSIS — G43909 Migraine, unspecified, not intractable, without status migrainosus: Secondary | ICD-10-CM | POA: Diagnosis not present

## 2020-05-15 HISTORY — PX: STERIOD INJECTION: SHX5046

## 2020-05-15 HISTORY — PX: KNEE ARTHROSCOPY WITH LATERAL MENISECTOMY: SHX6193

## 2020-05-15 HISTORY — DX: Family history of other specified conditions: Z84.89

## 2020-05-15 HISTORY — DX: Anxiety disorder, unspecified: F41.9

## 2020-05-15 HISTORY — DX: Depression, unspecified: F32.A

## 2020-05-15 SURGERY — ARTHROSCOPY, KNEE, WITH LATERAL MENISCECTOMY
Anesthesia: General | Site: Knee | Laterality: Right

## 2020-05-15 MED ORDER — OXYCODONE HCL 5 MG/5ML PO SOLN: 5.0000 mg | Freq: Once | ORAL | Status: AC | PRN

## 2020-05-15 MED ORDER — ACETAMINOPHEN 500 MG PO TABS
1000.0000 mg | ORAL_TABLET | Freq: Once | ORAL | Status: AC
  Administered 2020-05-15: 1000 mg via ORAL

## 2020-05-15 MED ORDER — OXYCODONE HCL 5 MG PO TABS: ORAL_TABLET | ORAL | 0 refills | Status: AC

## 2020-05-15 MED ORDER — DEXAMETHASONE SODIUM PHOSPHATE 4 MG/ML IJ SOLN
INTRAMUSCULAR | Status: DC | PRN
  Administered 2020-05-15: 8 mg via INTRAVENOUS

## 2020-05-15 MED ORDER — CEFAZOLIN SODIUM-DEXTROSE 2-4 GM/100ML-% IV SOLN
INTRAVENOUS | Status: AC
  Filled 2020-05-15: qty 100

## 2020-05-15 MED ORDER — LIDOCAINE 2% (20 MG/ML) 5 ML SYRINGE
INTRAMUSCULAR | Status: AC
  Filled 2020-05-15: qty 5

## 2020-05-15 MED ORDER — SCOPOLAMINE 1 MG/3DAYS TD PT72
MEDICATED_PATCH | TRANSDERMAL | Status: AC
  Filled 2020-05-15: qty 1

## 2020-05-15 MED ORDER — BUPIVACAINE HCL (PF) 0.25 % IJ SOLN
INTRAMUSCULAR | Status: DC | PRN
  Administered 2020-05-15: 1 mL

## 2020-05-15 MED ORDER — ACETAMINOPHEN 500 MG PO TABS
ORAL_TABLET | ORAL | Status: AC
  Filled 2020-05-15: qty 2

## 2020-05-15 MED ORDER — ONDANSETRON HCL 4 MG/2ML IJ SOLN
INTRAMUSCULAR | Status: DC | PRN
  Administered 2020-05-15: 4 mg via INTRAVENOUS

## 2020-05-15 MED ORDER — LIDOCAINE HCL (CARDIAC) PF 100 MG/5ML IV SOSY
PREFILLED_SYRINGE | INTRAVENOUS | Status: DC | PRN
  Administered 2020-05-15: 20 mg via INTRAVENOUS

## 2020-05-15 MED ORDER — DEXAMETHASONE SODIUM PHOSPHATE 10 MG/ML IJ SOLN
INTRAMUSCULAR | Status: AC
  Filled 2020-05-15: qty 1

## 2020-05-15 MED ORDER — ONDANSETRON HCL 4 MG/2ML IJ SOLN
INTRAMUSCULAR | Status: AC
  Filled 2020-05-15: qty 2

## 2020-05-15 MED ORDER — CELECOXIB 100 MG PO CAPS: 100.0000 mg | ORAL_CAPSULE | Freq: Two times a day (BID) | ORAL | 0 refills | Status: AC

## 2020-05-15 MED ORDER — METHYLPREDNISOLONE ACETATE 40 MG/ML IJ SUSP
INTRAMUSCULAR | Status: AC
  Filled 2020-05-15: qty 1

## 2020-05-15 MED ORDER — PROPOFOL 10 MG/ML IV BOLUS
INTRAVENOUS | Status: AC
  Filled 2020-05-15: qty 20

## 2020-05-15 MED ORDER — PROMETHAZINE HCL 25 MG/ML IJ SOLN
6.2500 mg | INTRAMUSCULAR | Status: DC | PRN
Start: 2020-05-15 — End: 2020-05-15

## 2020-05-15 MED ORDER — HYDROMORPHONE HCL 1 MG/ML IJ SOLN
0.2500 mg | INTRAMUSCULAR | Status: DC | PRN
Start: 2020-05-15 — End: 2020-05-15

## 2020-05-15 MED ORDER — LACTATED RINGERS IV SOLN: INTRAVENOUS | Status: DC

## 2020-05-15 MED ORDER — MIDAZOLAM HCL 2 MG/2ML IJ SOLN: 0.5000 mg | Freq: Once | INTRAMUSCULAR | Status: DC | PRN

## 2020-05-15 MED ORDER — OXYCODONE HCL 5 MG PO TABS
ORAL_TABLET | ORAL | Status: AC
  Filled 2020-05-15: qty 1

## 2020-05-15 MED ORDER — FENTANYL CITRATE (PF) 100 MCG/2ML IJ SOLN
INTRAMUSCULAR | Status: DC | PRN
  Administered 2020-05-15: 100 ug via INTRAVENOUS

## 2020-05-15 MED ORDER — CEFAZOLIN SODIUM-DEXTROSE 2-4 GM/100ML-% IV SOLN
2.0000 g | INTRAVENOUS | Status: AC
  Administered 2020-05-15: 2 g via INTRAVENOUS

## 2020-05-15 MED ORDER — MIDAZOLAM HCL 5 MG/5ML IJ SOLN
INTRAMUSCULAR | Status: DC | PRN
  Administered 2020-05-15: 1 mg via INTRAVENOUS

## 2020-05-15 MED ORDER — MEPERIDINE HCL 25 MG/ML IJ SOLN: 6.2500 mg | INTRAMUSCULAR | Status: DC | PRN

## 2020-05-15 MED ORDER — ACETAMINOPHEN 500 MG PO TABS: 1000.0000 mg | ORAL_TABLET | Freq: Three times a day (TID) | ORAL | 0 refills | Status: AC

## 2020-05-15 MED ORDER — MIDAZOLAM HCL 2 MG/2ML IJ SOLN
INTRAMUSCULAR | Status: AC
  Filled 2020-05-15: qty 2

## 2020-05-15 MED ORDER — SCOPOLAMINE 1 MG/3DAYS TD PT72
1.0000 | MEDICATED_PATCH | TRANSDERMAL | Status: DC
  Administered 2020-05-15: 1.5 mg via TRANSDERMAL

## 2020-05-15 MED ORDER — FENTANYL CITRATE (PF) 100 MCG/2ML IJ SOLN
INTRAMUSCULAR | Status: AC
  Filled 2020-05-15: qty 2

## 2020-05-15 MED ORDER — PROPOFOL 10 MG/ML IV BOLUS
INTRAVENOUS | Status: DC | PRN
  Administered 2020-05-15: 150 mg via INTRAVENOUS

## 2020-05-15 MED ORDER — BUPIVACAINE HCL 0.25 % IJ SOLN
INTRAMUSCULAR | Status: DC | PRN
  Administered 2020-05-15: 20 mL

## 2020-05-15 MED ORDER — ASPIRIN 81 MG PO CHEW: 81.0000 mg | CHEWABLE_TABLET | Freq: Two times a day (BID) | ORAL | 0 refills | Status: AC

## 2020-05-15 MED ORDER — ONDANSETRON HCL 4 MG PO TABS: 4.0000 mg | ORAL_TABLET | Freq: Three times a day (TID) | ORAL | 1 refills | Status: AC | PRN

## 2020-05-15 MED ORDER — OXYCODONE HCL 5 MG PO TABS
5.0000 mg | ORAL_TABLET | Freq: Once | ORAL | Status: AC | PRN
  Administered 2020-05-15: 5 mg via ORAL

## 2020-05-15 MED FILL — CELECOXIB 100 MG CAP: 100 | 30 days supply | Qty: 60 | Fill #0

## 2020-05-15 MED FILL — oxyCODONE HCL 5 MG TABS: 5 | 3 days supply | Qty: 12 | Fill #0

## 2020-05-15 MED FILL — ONDANSETRON HCL 4 MG TABLET: 4 | 3 days supply | Qty: 10 | Fill #0

## 2020-05-15 MED FILL — ASPIRIN CHILD 81 MG TAB CHE: 81 | 42 days supply | Qty: 84 | Fill #0

## 2020-05-15 SURGICAL SUPPLY — 38 items
APL PRP STRL LF DISP 70% ISPRP (MISCELLANEOUS) ×2
BANDAGE ESMARK 6X9 LF (GAUZE/BANDAGES/DRESSINGS) IMPLANT
BLADE CLIPPER SURG (BLADE) IMPLANT
BNDG CMPR 9X6 STRL LF SNTH (GAUZE/BANDAGES/DRESSINGS)
BNDG ELASTIC 6X5.8 VLCR STR LF (GAUZE/BANDAGES/DRESSINGS) ×3 IMPLANT
BNDG ESMARK 6X9 LF (GAUZE/BANDAGES/DRESSINGS)
CHLORAPREP W/TINT 26 (MISCELLANEOUS) ×3 IMPLANT
CLSR STERI-STRIP ANTIMIC 1/2X4 (GAUZE/BANDAGES/DRESSINGS) ×3 IMPLANT
CUFF TOURN SGL QUICK 34 (TOURNIQUET CUFF) ×3
CUFF TRNQT CYL 34X4.125X (TOURNIQUET CUFF) ×2 IMPLANT
DISSECTOR 3.5MM X 13CM CVD (MISCELLANEOUS) IMPLANT
DISSECTOR 4.0MMX13CM CVD (MISCELLANEOUS) ×3 IMPLANT
DRAPE ARTHROSCOPY W/POUCH 90 (DRAPES) ×3 IMPLANT
DRAPE IMP U-DRAPE 54X76 (DRAPES) ×3 IMPLANT
DRAPE U-SHAPE 47X51 STRL (DRAPES) ×3 IMPLANT
GAUZE SPONGE 4X4 12PLY STRL (GAUZE/BANDAGES/DRESSINGS) ×3 IMPLANT
GLOVE SRG 8 PF TXTR STRL LF DI (GLOVE) ×2 IMPLANT
GLOVE SURG ENC MOIS LTX SZ6.5 (GLOVE) ×3 IMPLANT
GLOVE SURG LTX SZ8 (GLOVE) ×6 IMPLANT
GLOVE SURG UNDER POLY LF SZ6.5 (GLOVE) ×3 IMPLANT
GLOVE SURG UNDER POLY LF SZ8 (GLOVE) ×3
GOWN STRL REUS W/ TWL LRG LVL3 (GOWN DISPOSABLE) ×2 IMPLANT
GOWN STRL REUS W/TWL LRG LVL3 (GOWN DISPOSABLE) ×3
GOWN STRL REUS W/TWL XL LVL3 (GOWN DISPOSABLE) ×3 IMPLANT
KIT TURNOVER KIT B (KITS) ×3 IMPLANT
MANIFOLD NEPTUNE II (INSTRUMENTS) IMPLANT
NDL SAFETY ECLIPSE 18X1.5 (NEEDLE) ×2 IMPLANT
NEEDLE HYPO 18GX1.5 SHARP (NEEDLE) ×3
NS IRRIG 1000ML POUR BTL (IV SOLUTION) IMPLANT
PACK ARTHROSCOPY DSU (CUSTOM PROCEDURE TRAY) ×3 IMPLANT
PORT APPOLLO RF 90DEGREE MULTI (SURGICAL WAND) IMPLANT
SLEEVE SCD COMPRESS KNEE MED (STOCKING) ×3 IMPLANT
SUT MNCRL AB 4-0 PS2 18 (SUTURE) ×3 IMPLANT
SYR 5ML LUER SLIP (SYRINGE) ×3 IMPLANT
TOWEL GREEN STERILE FF (TOWEL DISPOSABLE) ×3 IMPLANT
TUBE CONNECTING 20X1/4 (TUBING) ×3 IMPLANT
TUBING ARTHROSCOPY IRRIG 16FT (MISCELLANEOUS) ×3 IMPLANT
WATER STERILE IRR 1000ML POUR (IV SOLUTION) ×3 IMPLANT

## 2020-05-15 NOTE — Anesthesia Postprocedure Evaluation (Signed)
Anesthesia Post Note  Patient: Taylor Townsend  Procedure(s) Performed: KNEE ARTHROSCOPY WITH DEBRIDEMENT (CHONDROPLASTY) AND  LATERAL MENISECTOMY (Left Knee) INJECTION ASPIRATION ARTHROCENTESIS INTERMEDULLARY JOINT BURSA (Right Elbow)     Patient location during evaluation: PACU Anesthesia Type: General Level of consciousness: patient cooperative, awake and alert and oriented Pain management: pain level controlled Vital Signs Assessment: post-procedure vital signs reviewed and stable Respiratory status: spontaneous breathing, nonlabored ventilation and respiratory function stable Cardiovascular status: blood pressure returned to baseline and stable Postop Assessment: no apparent nausea or vomiting Anesthetic complications: no   No complications documented.  Last Vitals:  Vitals:   05/15/20 1215 05/15/20 1230  BP: 118/71 121/70  Pulse: (!) 58 64  Resp: 10 12  Temp: 36.5 C   SpO2: 100% 98%    Last Pain:  Vitals:   05/15/20 1230  TempSrc:   PainSc: 0-No pain                 Rhylan Gross,E. Londyn Wotton

## 2020-05-15 NOTE — Discharge Instructions (Signed)
Ophelia Charter MD, MPH Noemi Chapel, PA-C [place] [address] [phone] (tel)   [phone] (fax)   POST-OPERATIVE INSTRUCTIONS - Knee Arthroscopy  WOUND CARE - You may remove the Operative Dressing on Post-Op Day #3 (72hrs after surgery).   -  Alternatively if you would like you can leave dressing on until follow-up if within 7-8 days but keep it dry. - Leave steri-strips in place until they fall off on their own, usually 2 weeks postop. - An ACE wrap may be used to control swelling, do not wrap this too tight.  If the initial ACE wrap feels too tight you may loosen it. - There may be a small amount of fluid/bleeding leaking at the surgical site.  - This is normal; the knee is filled with fluid during the procedure and can leak for 24-48hrs after surgery. You may change/reinforce the bandage as needed.  - Use the Cryocuff or Ice as often as possible for the first 7 days, then as needed for pain relief. Always keep a towel, ACE wrap or other barrier between the cooling unit and your skin.  - You may shower on Post-Op Day #3. Gently pat the area dry. Do not soak the knee in water or submerge it.  - Do not go swimming in the pool or ocean until 4 weeks after surgery or when otherwise instructed.  Keep dry incisions as dry as possible.   BRACE/AMBULATION -  You will not need a brace after this procedure -            You can use crutches or a walker initially to help you ambulate, but this is not required  -            You can put full weight on your operative leg as you feel comfortable  REGIONAL ANESTHESIA (NERVE BLOCKS) - The anesthesia team may have performed a nerve block for you if safe in the setting of your care.  This is a great tool used to minimize pain.  Typically the block may start wearing off overnight.  This can be a challenging period but please utilize your as needed pain medications to try and manage this period and know it will be  a brief transition as the nerve block wears completely   POST-OP MEDICATIONS - Multimodal approach to pain control - In general your pain will be controlled with a combination of substances.  Prescriptions unless otherwise discussed are electronically sent to your pharmacy.  This is a carefully made plan we use to minimize narcotic use.     - Celebrex - Anti-inflammatory medication taken on a scheduled basis   - Take 1 tablet twice a day - Acetaminophen - Non-narcotic pain medicine taken on a scheduled basis    - Take two 500 mg tablets (1,000 mg total) every 8 hours - Oxycodone - This is a strong narcotic, to be used only on an "as needed" basis for pain. - Aspirin 81mg  - This medicine is used to minimize the risk of blood clots after surgery.   - Take 1 tablet twice a day  -  Zofran - take as needed for nausea   FOLLOW-UP   Please call the office to schedule a follow-up appointment for your incision check, 7-10 days post-operatively.   IF YOU HAVE ANY QUESTIONS, PLEASE FEEL FREE TO CALL OUR OFFICE.   HELPFUL INFORMATION  - If you had a block, it will wear off between 8-24 hrs  postop typically.  This is period when your pain may go from nearly zero to the pain you would have had post-op without the block.  This is an abrupt transition but nothing dangerous is happening.  You may take an extra dose of narcotic when this happens.   Keep your leg elevated to decrease swelling, which will then in turn decrease your pain. I would elevate the foot of your bed by putting a couple of couch pillows between your mattress and box spring. I would not keep pillow directly under your ankle.  - Do not sleep with a pillow behind your knee even if it is more comfortable as this may make it harder to get your knee fully straight long term.   There will be MORE swelling on days 1-3 than there is on the day of surgery.  This also is normal. The swelling will decrease with the anti-inflammatory  medication, ice and keeping it elevated. The swelling will make it more difficult to bend your knee. As the swelling goes down your motion will become easier   You may develop swelling and bruising that extends from your knee down to your calf and perhaps even to your foot over the next week. Do not be alarmed. This too is normal, and it is due to gravity   There may be some numbness adjacent to the incision site. This may last for 6-12 months or longer in some patients and is expected.   You may return to sedentary work/school in the next couple of days when you feel up to it. You will need to keep your leg elevated as much as possible    You should wean off your narcotic medicines as soon as you are able.  Most patients will be off or using minimal narcotics before their first postop appointment.    We suggest you use the pain medication the first night prior to going to bed, in order to ease any pain when the anesthesia wears off. You should avoid taking pain medications on an empty stomach as it will make you nauseous.   Do not drink alcoholic beverages or take illicit drugs when taking pain medications.   It is against the law to drive while taking narcotics. You cannot drive if your Right leg is in brace locked in extension.   Pain medication may make you constipated.  Below are a few solutions to try in this order:  o Decrease the amount of pain medication if you aren't having pain.  o Drink lots of decaffeinated fluids.  o Drink prune juice and/or eat dried prunes   o If the first 3 don't work start with additional solutions  o Take Colace - an over-the-counter stool softener  o Take Senokot - an over-the-counter laxative  o Take Miralax - a stronger over-the-counter laxative    For more information including helpful videos and documents visit our website:   [email or web address]   No Tylenol until 5:21 pm   Post Anesthesia Home Care  Instructions  Activity: Get plenty of rest for the remainder of the day. A responsible individual must stay with you for 24 hours following the procedure.  For the next 24 hours, DO NOT: -Drive a car -Paediatric nurse -Drink alcoholic beverages -Take any medication unless instructed by your physician -Make any legal decisions or sign important papers.  Meals: Start with liquid foods such as gelatin or soup. Progress to regular foods as tolerated. Avoid greasy, spicy, heavy foods. If nausea and/or  vomiting occur, drink only clear liquids until the nausea and/or vomiting subsides. Call your physician if vomiting continues.  Special Instructions/Symptoms: Your throat may feel dry or sore from the anesthesia or the breathing tube placed in your throat during surgery. If this causes discomfort, gargle with warm salt water. The discomfort should disappear within 24 hours.  If you had a scopolamine patch placed behind your ear for the management of post- operative nausea and/or vomiting:  1. The medication in the patch is effective for 72 hours, after which it should be removed.  Wrap patch in a tissue and discard in the trash. Wash hands thoroughly with soap and water. 2. You may remove the patch earlier than 72 hours if you experience unpleasant side effects which may include dry mouth, dizziness or visual disturbances. 3. Avoid touching the patch. Wash your hands with soap and water after contact with the patch.

## 2020-05-15 NOTE — Transfer of Care (Signed)
Immediate Anesthesia Transfer of Care Note  Patient: Taylor Townsend  Procedure(s) Performed: KNEE ARTHROSCOPY WITH DEBRIDEMENT (CHONDROPLASTY) AND  LATERAL MENISECTOMY (Left Knee) INJECTION ASPIRATION ARTHROCENTESIS INTERMEDULLARY JOINT BURSA (Right Elbow)  Patient Location: PACU  Anesthesia Type:General  Level of Consciousness: sedated  Airway & Oxygen Therapy: Patient Spontanous Breathing and Patient connected to face mask oxygen  Post-op Assessment: Report given to RN and Post -op Vital signs reviewed and stable  Post vital signs: Reviewed and stable  Last Vitals:  Vitals Value Taken Time  BP 118/71 05/15/20 1215  Temp    Pulse 57 05/15/20 1216  Resp 10 05/15/20 1216  SpO2 100 % 05/15/20 1216  Vitals shown include unvalidated device data.  Last Pain:  Vitals:   05/15/20 1022  TempSrc: Oral  PainSc: 0-No pain      Patients Stated Pain Goal: 5 (43/60/67 7034)  Complications: No complications documented.

## 2020-05-15 NOTE — Anesthesia Procedure Notes (Signed)
Procedure Name: LMA Insertion Performed by: [place], [place], CRNA Pre-anesthesia Checklist: Patient identified, Emergency Drugs available, Suction available and Patient being monitored Patient Re-evaluated:Patient Re-evaluated prior to induction Oxygen Delivery Method: Circle system utilized Preoxygenation: Pre-oxygenation with 100% oxygen Induction Type: IV induction Ventilation: Mask ventilation without difficulty LMA: LMA inserted LMA Size: 4.0 Number of attempts: 1 Airway Equipment and Method: Bite block Placement Confirmation: positive ETCO2 Tube secured with: Tape Dental Injury: Teeth and Oropharynx as per pre-operative assessment        

## 2020-05-15 NOTE — Anesthesia Preprocedure Evaluation (Addendum)
Anesthesia Evaluation  Patient identified by MRN, date of birth, ID band Patient awake    Reviewed: Allergy & Precautions, NPO status , Patient's Chart, lab work & pertinent test results  History of Anesthesia Complications Negative for: history of anesthetic complications  Airway Mallampati: I  TM Distance: >3 FB Neck ROM: Full    Dental  (+) Dental Advisory Given   Pulmonary neg pulmonary ROS,  05/12/2020 SARS coronavirus NEG   breath sounds clear to auscultation       Cardiovascular negative cardio ROS   Rhythm:Regular Rate:Normal     Neuro/Psych  Headaches, Anxiety Depression    GI/Hepatic negative GI ROS, Neg liver ROS,   Endo/Other  negative endocrine ROS  Renal/GU negative Renal ROS     Musculoskeletal   Abdominal   Peds  Hematology negative hematology ROS (+)   Anesthesia Other Findings   Reproductive/Obstetrics                            Anesthesia Physical Anesthesia Plan  ASA: II  Anesthesia Plan: General   Post-op Pain Management:    Induction: Intravenous  PONV Risk Score and Plan: 3 and Ondansetron and Dexamethasone  Airway Management Planned: LMA  Additional Equipment: None  Intra-op Plan:   Post-operative Plan:   Informed Consent: I have reviewed the patients History and Physical, chart, labs and discussed the procedure including the risks, benefits and alternatives for the proposed anesthesia with the patient or authorized representative who has indicated his/her understanding and acceptance.     Dental advisory given  Plan Discussed with: CRNA and Surgeon  Anesthesia Plan Comments:         Anesthesia Quick Evaluation

## 2020-05-15 NOTE — Interval H&P Note (Signed)
History and Physical Interval Note:  05/15/2020 10:09 AM  Taylor Townsend  has presented today for surgery, with the diagnosis of LEFT KNEE CHONDROMALACIA PATELLAE, LATERAL MENISCUS TEAR RIGHT ELBOW LATERAL EPICONDYLITIS.  The various methods of treatment have been discussed with the patient and family. After consideration of risks, benefits and other options for treatment, the patient has consented to  Procedure(s): KNEE ARTHROSCOPY WITH DEBRIDEMENT (CHONDROPLASTY) AND  LATERAL MENISECTOMY (Left) INJECTION ASPIRATION ARTHROCENTESIS INTERMEDULLARY JOINT BURSA (Right) as a surgical intervention.  The patient's history has been reviewed, patient examined, no change in status, stable for surgery.  I have reviewed the patient's chart and labs.  Questions were answered to the patient's satisfaction.     Hiram Gash

## 2020-05-16 ENCOUNTER — Encounter (HOSPITAL_BASED_OUTPATIENT_CLINIC_OR_DEPARTMENT_OTHER): Payer: Self-pay | Admitting: Orthopaedic Surgery

## 2020-05-16 NOTE — Op Note (Signed)
Orthopaedic Surgery Operative Note (CSN: [id number])  Sady Monaco Fitzner  [birth year] Date of Surgery: 05/15/2020   Diagnoses:  Left knee lateral meniscus tear  Procedure: Left partial lateral meniscectomy and patellofemoral with lateral compartment chondroplasty   Operative Finding Exam under anesthesia: Motion limitation no instability Suprapatellar pouch: Normal Patellofemoral Compartment: Lateral central and central trochlear full-thickness for grade 4 cartilage loss 2 x 3 cm corresponding areas Medial Compartment: Normal Lateral Compartment: Grade 3 and 4 changes in the central tibial plateau with a degenerative peripheral complex lateral meniscus tear debrided back to 30% total meniscal volume resected.  Femur had grade 2 changes scattered throughout Intercondylar Notch: Normal  Successful completion of the planned procedure.  Patient's arthritis is more significant than we had likely anticipated.  Her meniscus tear was routine.  She may be candidate for unicompartmental lateral arthroplasty with Dr. Garen Lah if she fails scope.  Post-operative plan: The patient will be weightbearing to tolerance.  The patient will be discharged home.  DVT prophylaxis Aspirin 81 mg twice daily for 6 weeks.  Pain control with PRN pain medication preferring oral medicines.  Follow up plan will be scheduled in approximately 7 days for incision check and XR.  Post-Op Diagnosis: Same Surgeons:Primary: Hiram Gash, MD Assistants:Caroline McBane PA-C Location: [facility] OR ROOM 2 Anesthesia: General with local Antibiotics: Ancef 2 g Tourniquet time: * No tourniquets in log * Estimated Blood Loss: Minimal Complications: None Specimens: None Implants: * No implants in log *  Indications for Surgery:   GLENDY BARSANTI is a 55 y.o. female with lateral knee pain and arthritis noted on MRI with mechanical symptoms.  Benefits and risks of operative and nonoperative management were discussed prior to  surgery with patient/guardian(s) and informed consent form was completed.  Specific risks including infection, need for additional surgery, need postvasectomy syndrome, continued arthrosis, continued pain, stiffness amongst others   Procedure:   The patient was identified properly. Informed consent was obtained and the surgical site was marked. The patient was taken up to suite where general anesthesia was induced. The patient was placed in the supine position with a post against the surgical leg and a nonsterile tourniquet applied. The surgical leg was then prepped and draped usual sterile fashion.  A standard surgical timeout was performed.  2 standard anterior portals were made and diagnostic arthroscopy performed. Please note the findings as noted above.  Performed a gentle chondroplasty of the patellofemoral joint as well as the lateral femoral and tibial plateau.  No loose cartilage or loose bodies identified.  Partial lateral meniscectomy was performed with a shaver and a basket back to a stable base resecting about 30% total lateral meniscal volume.  Incisions closed with absorbable suture. The patient was awoken from general anesthesia and taken to the PACU in stable condition without complication.   Noemi Chapel, PA-C, present and scrubbed throughout the case, critical for completion in a timely fashion, and for retraction, instrumentation, closure.

## 2020-05-21 MED FILL — VENLAFAXINE HCL ER 37.5 MG: 37.5 | 30 days supply | Qty: 30 | Fill #3

## 2020-05-22 DIAGNOSIS — S83282D Other tear of lateral meniscus, current injury, left knee, subsequent encounter: Secondary | ICD-10-CM | POA: Diagnosis not present

## 2020-05-22 DIAGNOSIS — M159 Polyosteoarthritis, unspecified: Secondary | ICD-10-CM | POA: Diagnosis not present

## 2020-05-22 DIAGNOSIS — M2550 Pain in unspecified joint: Secondary | ICD-10-CM | POA: Diagnosis not present

## 2020-05-22 DIAGNOSIS — M7702 Medial epicondylitis, left elbow: Secondary | ICD-10-CM | POA: Diagnosis not present

## 2020-05-22 DIAGNOSIS — M7701 Medial epicondylitis, right elbow: Secondary | ICD-10-CM | POA: Diagnosis not present

## 2020-05-22 DIAGNOSIS — R5383 Other fatigue: Secondary | ICD-10-CM | POA: Diagnosis not present

## 2020-05-22 DIAGNOSIS — Z6822 Body mass index (BMI) 22.0-22.9, adult: Secondary | ICD-10-CM | POA: Diagnosis not present

## 2020-05-29 ENCOUNTER — Other Ambulatory Visit (HOSPITAL_COMMUNITY): Payer: Self-pay

## 2020-06-02 ENCOUNTER — Other Ambulatory Visit (HOSPITAL_COMMUNITY): Payer: Self-pay | Admitting: Emergency Medicine

## 2020-06-04 ENCOUNTER — Ambulatory Visit (HOSPITAL_COMMUNITY)
Admission: RE | Admit: 2020-06-04 | Discharge: 2020-06-04 | Disposition: A | Discharge status: Home / Self Care | Payer: Self-pay | Source: Ambulatory Visit | Attending: Cardiology | Admitting: Cardiology

## 2020-06-04 ENCOUNTER — Other Ambulatory Visit: Payer: Self-pay

## 2020-06-05 ENCOUNTER — Other Ambulatory Visit (HOSPITAL_COMMUNITY): Payer: Self-pay

## 2020-06-12 ENCOUNTER — Other Ambulatory Visit (HOSPITAL_COMMUNITY): Payer: Self-pay

## 2020-06-18 ENCOUNTER — Other Ambulatory Visit (HOSPITAL_COMMUNITY): Payer: Self-pay

## 2020-06-20 ENCOUNTER — Other Ambulatory Visit (HOSPITAL_COMMUNITY): Payer: Self-pay

## 2020-06-20 DIAGNOSIS — U071 COVID-19: Secondary | ICD-10-CM | POA: Diagnosis not present

## 2020-06-20 MED FILL — Venlafaxine HCl Cap ER 24HR 37.5 MG (Base Equivalent): ORAL | 30 days supply | Qty: 30 | Fill #0 | Status: AC

## 2020-06-30 ENCOUNTER — Ambulatory Visit (INDEPENDENT_AMBULATORY_CARE_PROVIDER_SITE_OTHER): Payer: 59 | Admitting: Obstetrics & Gynecology

## 2020-06-30 ENCOUNTER — Other Ambulatory Visit (HOSPITAL_COMMUNITY)
Admission: RE | Admit: 2020-06-30 | Discharge: 2020-06-30 | Disposition: A | Discharge status: Home / Self Care | Payer: 59 | Source: Ambulatory Visit | Attending: Obstetrics & Gynecology | Admitting: Obstetrics & Gynecology

## 2020-06-30 ENCOUNTER — Encounter (HOSPITAL_BASED_OUTPATIENT_CLINIC_OR_DEPARTMENT_OTHER): Payer: Self-pay | Admitting: Obstetrics & Gynecology

## 2020-06-30 ENCOUNTER — Other Ambulatory Visit: Payer: Self-pay

## 2020-06-30 ENCOUNTER — Other Ambulatory Visit (HOSPITAL_COMMUNITY): Payer: Self-pay

## 2020-06-30 VITALS — BP 109/79 | HR 73 | Ht 68.0 in | Wt 154.6 lb

## 2020-06-30 DIAGNOSIS — Z124 Encounter for screening for malignant neoplasm of cervix: Secondary | ICD-10-CM | POA: Insufficient documentation

## 2020-06-30 DIAGNOSIS — Z01419 Encounter for gynecological examination (general) (routine) without abnormal findings: Secondary | ICD-10-CM

## 2020-06-30 DIAGNOSIS — Z9889 Other specified postprocedural states: Secondary | ICD-10-CM

## 2020-06-30 DIAGNOSIS — Z8619 Personal history of other infectious and parasitic diseases: Secondary | ICD-10-CM

## 2020-06-30 DIAGNOSIS — E559 Vitamin D deficiency, unspecified: Secondary | ICD-10-CM | POA: Insufficient documentation

## 2020-06-30 DIAGNOSIS — M8580 Other specified disorders of bone density and structure, unspecified site: Secondary | ICD-10-CM | POA: Insufficient documentation

## 2020-06-30 DIAGNOSIS — N941 Unspecified dyspareunia: Secondary | ICD-10-CM | POA: Diagnosis not present

## 2020-06-30 DIAGNOSIS — N952 Postmenopausal atrophic vaginitis: Secondary | ICD-10-CM | POA: Diagnosis not present

## 2020-06-30 DIAGNOSIS — Z860100 Personal history of colon polyps, unspecified: Secondary | ICD-10-CM | POA: Insufficient documentation

## 2020-06-30 MED ORDER — ESTRADIOL 0.1 MG/GM VA CREA
TOPICAL_CREAM | VAGINAL | 3 refills | Status: DC
  Filled 2020-06-30: qty 42.5, 74d supply, fill #0

## 2020-06-30 NOTE — Patient Instructions (Signed)
[place]

## 2020-06-30 NOTE — Progress Notes (Signed)
55 y.o. G63P1001 Married White or Caucasian female here for annual exam.  Doing well.  Had Covid about three weeks.  Denies vaginal bleeding.    She continues to have pain with intercourse.  She used osphena but it made her feel emotional.  Does not want to be on HRT and did stop this.  Using vaginal estrogen cream but admits not regularly.  Has pain with insertion only.  Had coronary calcium score and this was normal.    Had blood work for RA.  Saw Dr. Trudie Reed.    Patient's last menstrual period was 07/04/2017.          Sexually active: Yes.    The current method of family planning is post menopausal status.    Exercising: No.  Smoker:  no  Health Maintenance:  Pap:  2018 History of abnormal Pap:  H/o LEEP 1996 MMG:  01/2020 Colonoscopy:  2016.  Scheduled for next month. BMD:   07/2017, -1.4 TDaP:  05/21/2013 Pneumonia vaccine(s):  Not indicated Shingrix:   discussed Hep C testing: 06/2017 Screening Labs: will have blood work done with Dr. Kathyrn Lass in June   reports that she has never smoked. She has never used smokeless tobacco. She reports current alcohol use. She reports that she does not use drugs.  Past Medical History:  Diagnosis Date  . Abnormal Pap smear of cervix   . Anxiety   . Depression   . Family history of adverse reaction to anesthesia    grandmother went into cardiac arrest  . Headache(784.0)    menstrual migraines  . History of loop electrical excision procedure (LEEP) 1996  . HPV (human papilloma virus) infection    condyloma    Past Surgical History:  Procedure Laterality Date  . GYNECOLOGIC CRYOSURGERY  1984  . HYSTEROSCOPY WITH D & C  2006  . KNEE ARTHROSCOPY WITH LATERAL MENISECTOMY Left 05/15/2020   Procedure: KNEE ARTHROSCOPY WITH DEBRIDEMENT (CHONDROPLASTY) AND  LATERAL MENISECTOMY;  Surgeon: Hiram Gash, MD;  Location: [facility];  Service: Orthopedics;  Laterality: Left;  . LEEP  1996   CIN [place]  7/07  .  RECTAL POLYPECTOMY  2001   anal polyp  . SKIN BIOPSY     area froze off-left shoulder  . STERIOD INJECTION Right 05/15/2020   Procedure: INJECTION ASPIRATION ARTHROCENTESIS INTERMEDULLARY JOINT BURSA;  Surgeon: Hiram Gash, MD;  Location: [facility];  Service: Orthopedics;  Laterality: Right;  Marland Kitchen VARICOSE VEIN SURGERY    . WISDOM TOOTH EXTRACTION      Current Outpatient Medications  Medication Sig Dispense Refill  . acetaminophen (TYLENOL) 500 MG tablet TAKE 2 TABLETS (1,000 MG TOTAL) BY MOUTH EVERY 8 (EIGHT) HOURS FOR 14 DAYS. 84 tablet 0  . cetirizine (ZYRTEC) 10 MG tablet Take 10 mg by mouth daily. As needed    . cholecalciferol (VITAMIN D) 1000 units tablet Take 1,000 Units by mouth daily.    . fluticasone (FLONASE) 50 MCG/ACT nasal spray Place 2 sprays into the nose as needed.     Marland Kitchen MAGNESIUM PO Take by mouth daily.     . polyethylene glycol (MIRALAX / GLYCOLAX) packet Take 17 g by mouth daily.    Marland Kitchen estradiol (ESTRACE) 0.1 MG/GM vaginal cream Insert 1 gram vaginally and apply topically twice weekly as directed. 42.5 g 3   No current facility-administered medications for this visit.    Family History  Problem Relation Age of Onset  .  Endometrial cancer Maternal Grandmother           . Hypertension Mother   . Coronary artery disease Mother   . Bipolar disorder Mother   . Osteoporosis Mother   . Pancreatic cancer Maternal Grandfather   . Hypertension Father   . Coronary artery disease Father   . Melanoma Father   . Hypertension Other        all grandparents  . Bipolar disorder Brother     Review of Systems  Musculoskeletal: Positive for arthralgias.    Exam:   BP 109/79 (BP Location: Right Arm, Patient Position: Sitting, Cuff Size: Small)   Pulse 73   Ht 5\' 8"  (1.727 m)   Wt 154 lb 9.6 oz (70.1 kg)   LMP 07/04/2017   BMI 23.51 kg/m   Height: 5\' 8"  (172.7 cm)  General appearance: alert, cooperative and appears stated age Head: Normocephalic,  without obvious abnormality, atraumatic Neck: no adenopathy, supple, symmetrical, trachea midline and thyroid normal to inspection and palpation Lungs: clear to auscultation bilaterally Breasts: normal appearance, no masses or tenderness Heart: regular rate and rhythm Abdomen: soft, non-tender; bowel sounds normal; no masses,  no organomegaly Extremities: extremities normal, atraumatic, no cyanosis or edema Skin: Skin color, texture, turgor normal. No rashes or lesions Lymph nodes: Cervical, supraclavicular, and axillary nodes normal. No abnormal inguinal nodes palpated Neurologic: Grossly normal   Pelvic: External genitalia:  no lesions              Urethra:  normal appearing urethra with no masses, tenderness or lesions              Bartholins and Skenes: normal                 Vagina: normal appearing vagina with normal color and no discharge, no lesions              Cervix: no lesions              Pap taken: Yes.   Bimanual Exam:  Uterus:  normal size, contour, position, consistency, mobility, non-tender              Adnexa: normal adnexa and no mass, fullness, tenderness               Rectovaginal: Confirms               Anus:  normal sphincter tone, no lesions  Chaperone, Octaviano Batty, CMA, was present for exam.  Assessment/Plan: 1. Well woman exam with routine gynecological exam - pap and HR HPV obtained - MMG 01/2020 - colonoscopy 2016.  Already scheduled. - BMD 07/2017 - vaccines updated - lab work scheduled with Kathyrn Lass in June, 2022  2. Cervical cancer screening - Cytology - PAP( [place])  3. History of condyloma acuminatum  4. Vaginal atrophy - will try to use vaginal estrogen twice weekly and then externally twice weekly. - discussed possible dilator use or topical lidocaine.  She will see how much improvement she has with twice weekly estrogen use.  5. 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 History of loop electrical excision procedure (LEEP) - in 1996

## 2020-07-01 DIAGNOSIS — N941 Unspecified dyspareunia: Secondary | ICD-10-CM | POA: Insufficient documentation

## 2020-07-01 DIAGNOSIS — Z8619 Personal history of other infectious and parasitic diseases: Secondary | ICD-10-CM | POA: Insufficient documentation

## 2020-07-01 DIAGNOSIS — N952 Postmenopausal atrophic vaginitis: Secondary | ICD-10-CM | POA: Insufficient documentation

## 2020-07-01 DIAGNOSIS — Z9889 Other specified postprocedural states: Secondary | ICD-10-CM | POA: Insufficient documentation

## 2020-07-01 LAB — CYTOLOGY - PAP
Adequacy: ABSENT
Comment: NEGATIVE
Diagnosis: NEGATIVE
High risk HPV: NEGATIVE

## 2020-07-03 DIAGNOSIS — H5213 Myopia, bilateral: Secondary | ICD-10-CM | POA: Diagnosis not present

## 2020-07-03 DIAGNOSIS — H52223 Regular astigmatism, bilateral: Secondary | ICD-10-CM | POA: Diagnosis not present

## 2020-07-22 ENCOUNTER — Other Ambulatory Visit (HOSPITAL_COMMUNITY): Payer: Self-pay

## 2020-07-22 MED FILL — Venlafaxine HCl Cap ER 24HR 37.5 MG (Base Equivalent): ORAL | 30 days supply | Qty: 30 | Fill #1 | Status: AC

## 2020-08-06 DIAGNOSIS — Z13220 Encounter for screening for lipoid disorders: Secondary | ICD-10-CM | POA: Diagnosis not present

## 2020-08-06 DIAGNOSIS — Z23 Encounter for immunization: Secondary | ICD-10-CM | POA: Diagnosis not present

## 2020-08-06 DIAGNOSIS — K5900 Constipation, unspecified: Secondary | ICD-10-CM | POA: Diagnosis not present

## 2020-08-06 DIAGNOSIS — F4323 Adjustment disorder with mixed anxiety and depressed mood: Secondary | ICD-10-CM | POA: Diagnosis not present

## 2020-08-06 DIAGNOSIS — Z86010 Personal history of colonic polyps: Secondary | ICD-10-CM | POA: Diagnosis not present

## 2020-08-06 DIAGNOSIS — M8580 Other specified disorders of bone density and structure, unspecified site: Secondary | ICD-10-CM | POA: Diagnosis not present

## 2020-08-06 DIAGNOSIS — Z6822 Body mass index (BMI) 22.0-22.9, adult: Secondary | ICD-10-CM | POA: Diagnosis not present

## 2020-08-06 DIAGNOSIS — J309 Allergic rhinitis, unspecified: Secondary | ICD-10-CM | POA: Diagnosis not present

## 2020-08-06 DIAGNOSIS — Z0000 Encounter for general adult medical examination without abnormal findings: Secondary | ICD-10-CM | POA: Diagnosis not present

## 2020-08-06 DIAGNOSIS — E559 Vitamin D deficiency, unspecified: Secondary | ICD-10-CM | POA: Diagnosis not present

## 2020-08-18 ENCOUNTER — Other Ambulatory Visit (HOSPITAL_COMMUNITY): Payer: Self-pay

## 2020-08-21 DIAGNOSIS — K621 Rectal polyp: Secondary | ICD-10-CM | POA: Diagnosis not present

## 2020-08-21 DIAGNOSIS — Z86010 Personal history of colonic polyps: Secondary | ICD-10-CM | POA: Diagnosis not present

## 2021-07-09 ENCOUNTER — Ambulatory Visit (HOSPITAL_BASED_OUTPATIENT_CLINIC_OR_DEPARTMENT_OTHER): Payer: 59 | Admitting: Obstetrics & Gynecology

## 2021-11-02 ENCOUNTER — Ambulatory Visit (HOSPITAL_BASED_OUTPATIENT_CLINIC_OR_DEPARTMENT_OTHER): Payer: Self-pay | Admitting: Obstetrics & Gynecology

## 2022-03-24 IMAGING — RF DG ESOPHAGUS
7 of 8 series · 16 of 22 positions shown · non-contrast
Comparison: None.

CLINICAL DATA: Dysphagia, food sticking high

EXAM:
ESOPHOGRAM / BARIUM SWALLOW / BARIUM TABLET STUDY
TECHNIQUE: Combined double contrast and single contrast examination performed
using effervescent crystals, thick barium liquid, and thin barium
liquid. The patient was observed with fluoroscopy swallowing a 13 mm
barium sulphate tablet.
FLUOROSCOPY TIME:  Fluoroscopy Time:  48 seconds
Radiation Exposure Index (if provided by the fluoroscopic device):
1.7 mGy

[Series 1: cp_standard · 0.51mm/px · 2 of 26 frames shown (1 of 7)]
[frame 4/26]
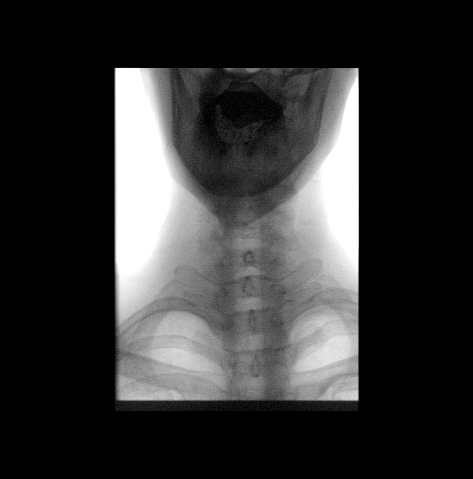
[frame 23/26]
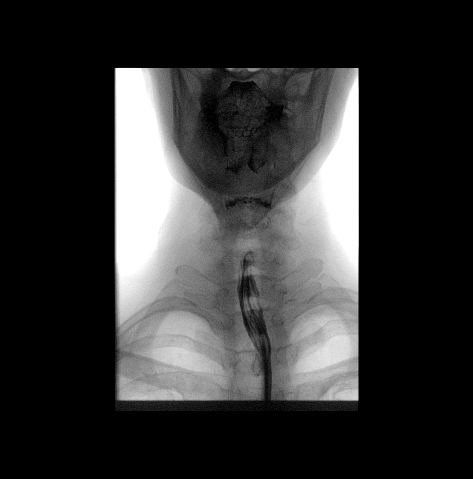

[Series 2: cp_standard · 0.51mm/px · 3 of 26 frames shown (2 of 7)]
[frame 4/26]
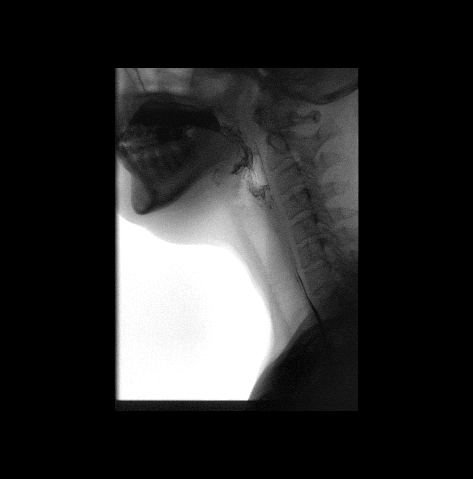
[frame 14/26]
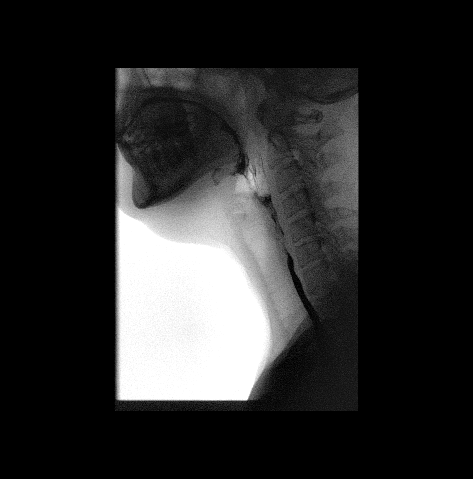
[frame 26/26]
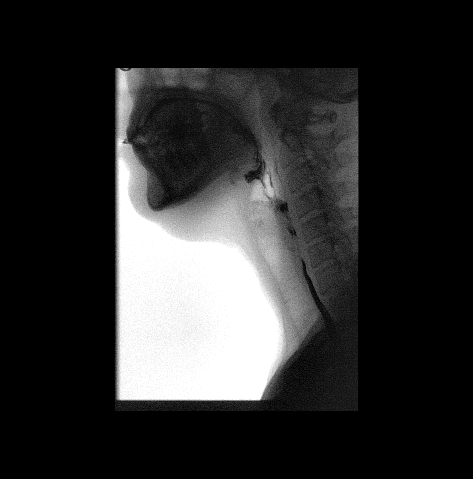

[Series 3: cp_standard · 0.51mm/px · 3 of 54 frames shown (3 of 7)]
[frame 9/54]
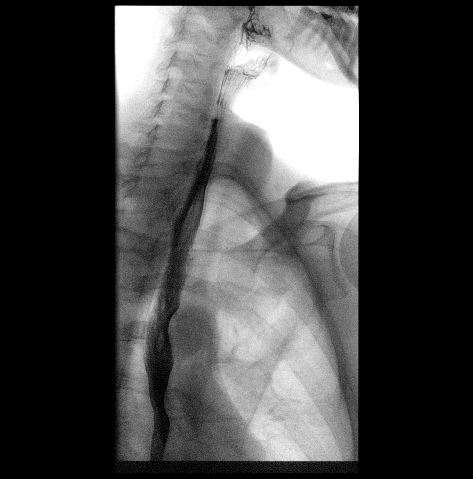
[frame 23/54]
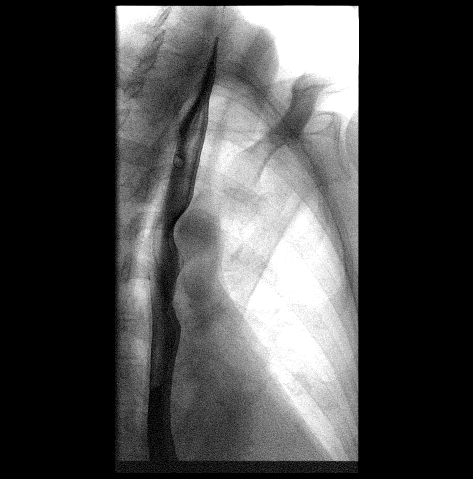
[frame 46/54]
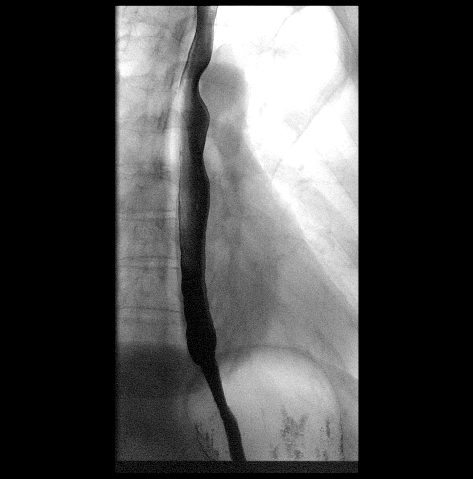

[Series 4: cp_standard · 0.25mm/px · 1 of 1 slices shown (4 of 7)]
[im 1/1]
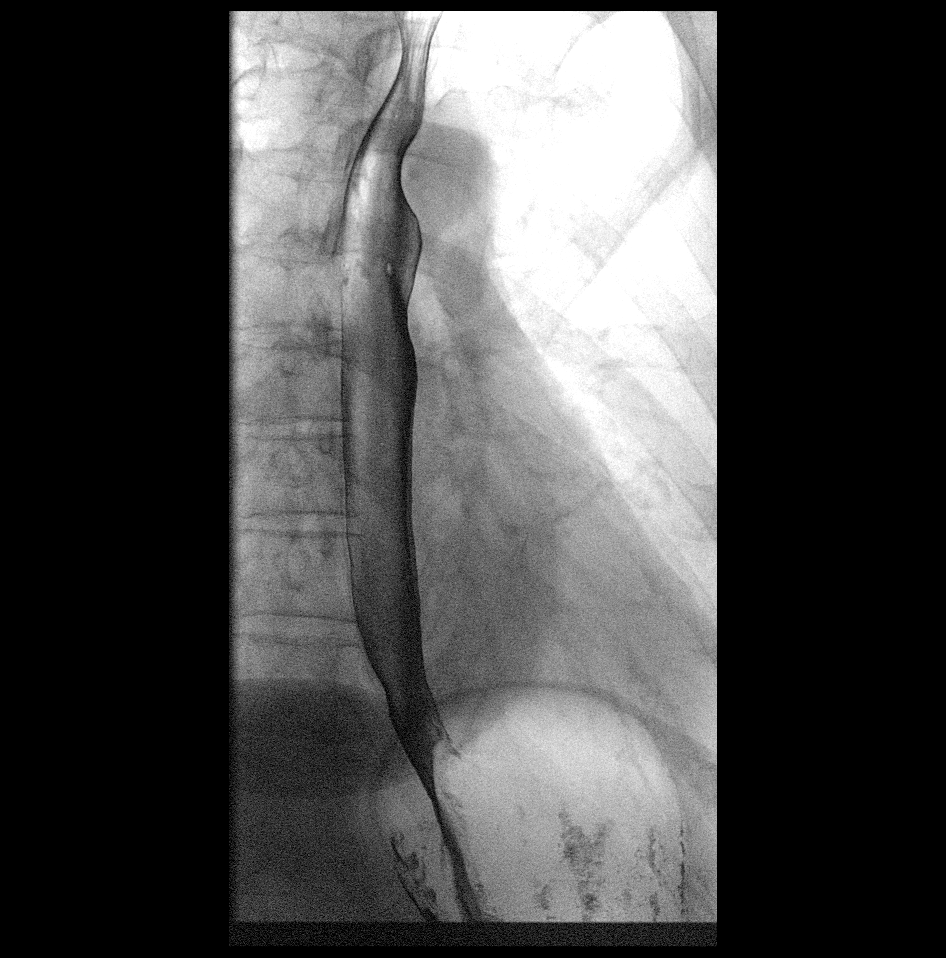

[Series 6: cp_standard · 0.25mm/px · 1 of 1 slices shown (5 of 7)]
[im 1/1]
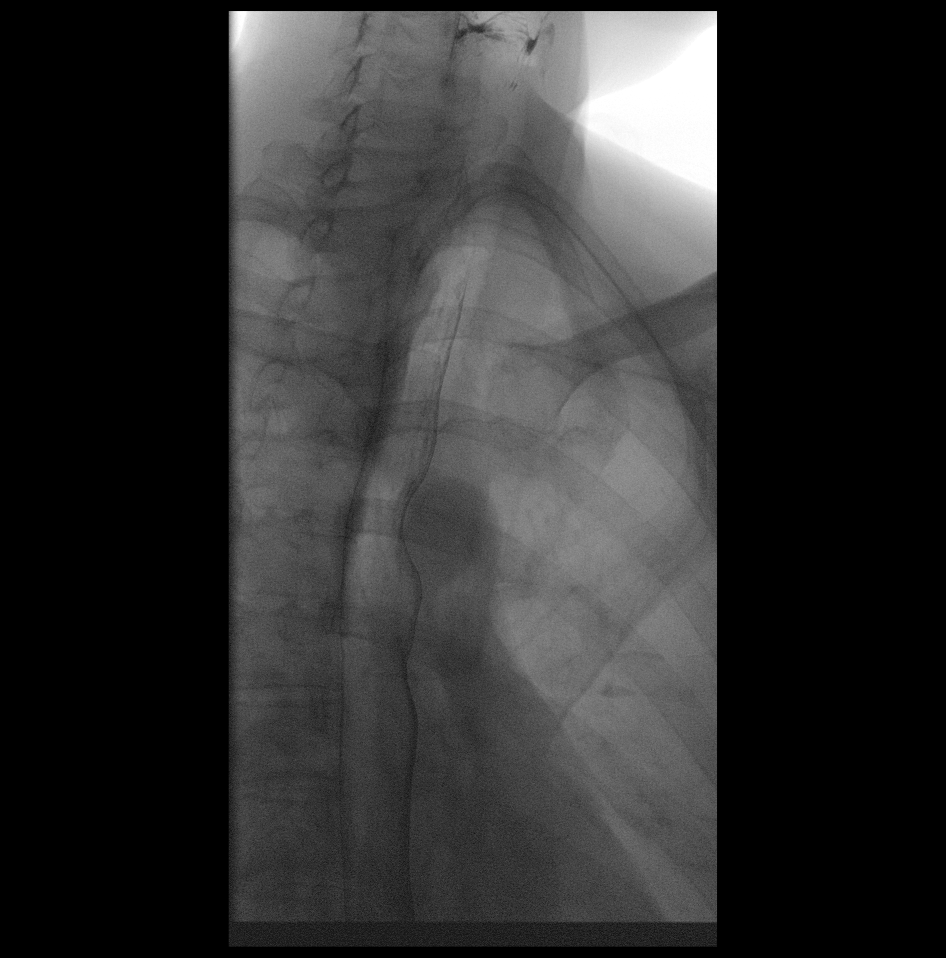

[Series 7: cp_standard · 0.51mm/px · 3 of 61 frames shown (6 of 7)]
[frame 10/61]
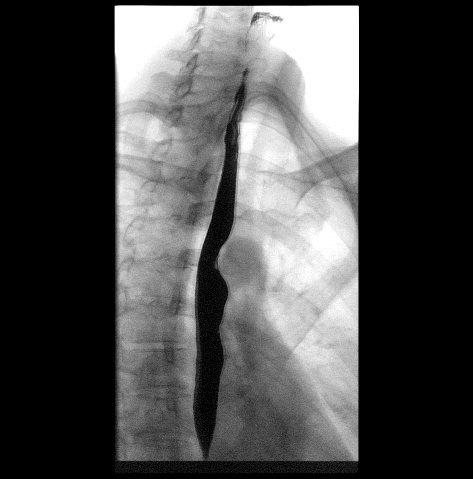
[frame 31/61]
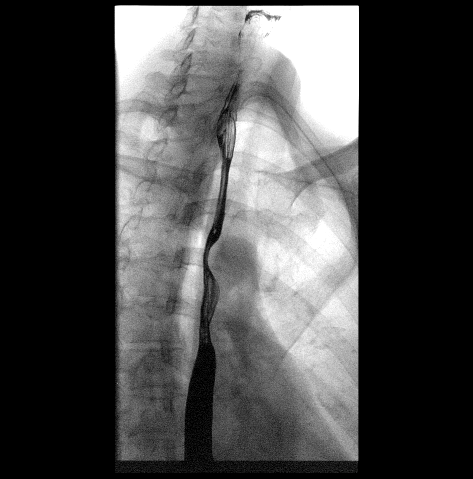
[frame 52/61]
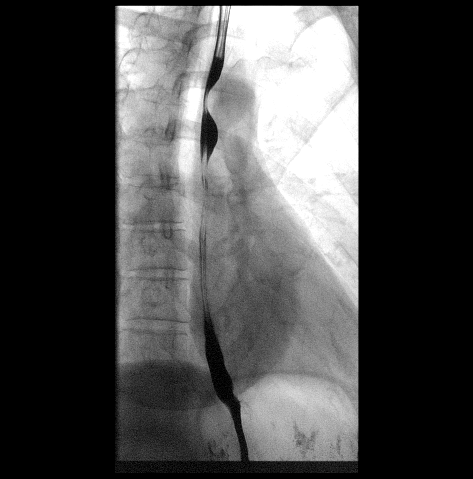

[Series 8: cp_standard · 0.52mm/px · 3 of 38 frames shown (7 of 7)]
[frame 6/38]
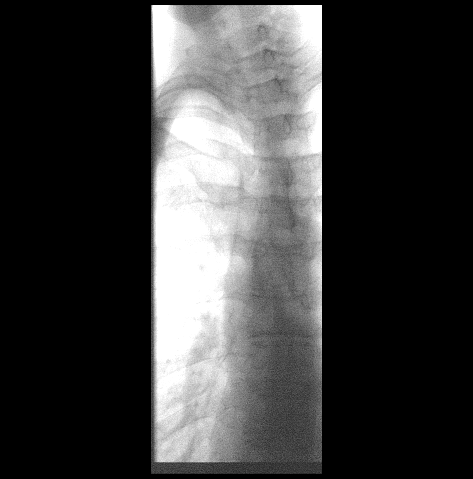
[frame 10/38]
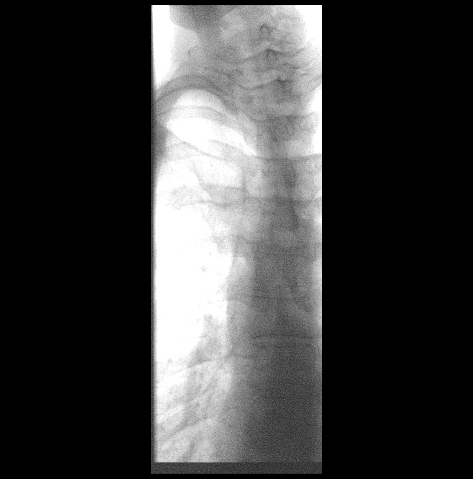
[frame 33/38]
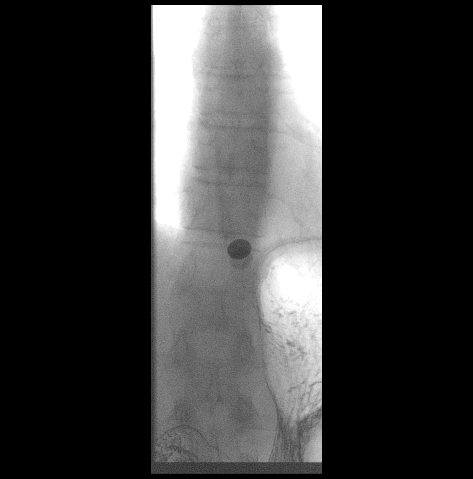

[16 of 22 positions shown; findings below may reference images not displayed]

FINDINGS: Patient swallowed barium without difficulty. There is no mass,
stricture, or other abnormality identified. Normal motility. No
hiatal hernia. No evidence of spontaneous gastroesophageal reflux.
Barium pill was swallowed and passed without difficulty.
IMPRESSION: Normal esophagram.

## 2022-08-05 ENCOUNTER — Ambulatory Visit (HOSPITAL_BASED_OUTPATIENT_CLINIC_OR_DEPARTMENT_OTHER): Payer: Self-pay | Admitting: Obstetrics & Gynecology

## 2022-08-23 ENCOUNTER — Ambulatory Visit (HOSPITAL_BASED_OUTPATIENT_CLINIC_OR_DEPARTMENT_OTHER): Payer: Self-pay | Admitting: Obstetrics & Gynecology

## 2022-09-07 ENCOUNTER — Ambulatory Visit (HOSPITAL_BASED_OUTPATIENT_CLINIC_OR_DEPARTMENT_OTHER): Payer: No Typology Code available for payment source | Admitting: Obstetrics & Gynecology

## 2022-09-07 ENCOUNTER — Encounter (HOSPITAL_BASED_OUTPATIENT_CLINIC_OR_DEPARTMENT_OTHER): Payer: Self-pay | Admitting: Obstetrics & Gynecology

## 2022-09-07 ENCOUNTER — Other Ambulatory Visit (HOSPITAL_COMMUNITY)
Admission: RE | Admit: 2022-09-07 | Discharge: 2022-09-07 | Disposition: A | Discharge status: Home / Self Care | Payer: No Typology Code available for payment source | Source: Ambulatory Visit | Attending: Obstetrics & Gynecology | Admitting: Obstetrics & Gynecology

## 2022-09-07 ENCOUNTER — Other Ambulatory Visit (HOSPITAL_BASED_OUTPATIENT_CLINIC_OR_DEPARTMENT_OTHER): Payer: Self-pay | Admitting: Obstetrics & Gynecology

## 2022-09-07 VITALS — BP 109/66 | HR 68 | Ht 68.5 in | Wt 150.6 lb

## 2022-09-07 DIAGNOSIS — Z124 Encounter for screening for malignant neoplasm of cervix: Secondary | ICD-10-CM

## 2022-09-07 DIAGNOSIS — Z9889 Other specified postprocedural states: Secondary | ICD-10-CM

## 2022-09-07 DIAGNOSIS — N952 Postmenopausal atrophic vaginitis: Secondary | ICD-10-CM | POA: Diagnosis not present

## 2022-09-07 DIAGNOSIS — Z1331 Encounter for screening for depression: Secondary | ICD-10-CM

## 2022-09-07 DIAGNOSIS — Z01419 Encounter for gynecological examination (general) (routine) without abnormal findings: Secondary | ICD-10-CM

## 2022-09-07 DIAGNOSIS — M85852 Other specified disorders of bone density and structure, left thigh: Secondary | ICD-10-CM

## 2022-09-07 MED ORDER — ESTRADIOL 0.1 MG/GM VA CREA: TOPICAL_CREAM | VAGINAL | 3 refills | Status: DC

## 2022-09-07 MED ORDER — ESTRADIOL 7.5 MCG/24HR VA RING: 1.0000 | VAGINAL_RING | VAGINAL | 3 refills | Status: DC

## 2022-09-07 NOTE — Progress Notes (Signed)
57 y.o. G43P1001 Married White or Caucasian female here for annual exam.  Denies vaginal bleeding.  Moved to [place].  Moving next door into mother in law's home (who passed June 8th).    Daughter getting married in May, 2025.  Saw in network provider last year for gyn exam.  Wasn't great visit.    Still having vaginal dryness.  Using estradiol cream.  Would like to try ring.  We have discussed in the past.  Reviewed administration, placement and removal.  Was prescribed fosamax this past year after BMD.  Had jaw pain and ultimately stopped.  Symptoms resolved.  Patient's last menstrual period was 04/22/2017 (approximate).          Sexually active: Yes.    The current method of family planning is post menopausal status.    Exercising: No. Smoker:  no  Health Maintenance: Pap:  06/30/2020 Negative History of abnormal Pap:  LEEP 1996 MMG:  04/21/2022 Colonoscopy:  2021 or 2022 with Dr. Ewing Schlein.  Follow up 5 years.   BMD:   01/2021, osteopenia Screening Labs: reviewed in Care Everywhere   reports that she has never smoked. She has never used smokeless tobacco. She reports current alcohol use. She reports that she does not use drugs.  Past Medical History:  Diagnosis Date   Abnormal Pap smear of cervix    Anxiety    Depression    Family history of adverse reaction to anesthesia    grandmother went into cardiac arrest   Headache(784.0)    menstrual migraines   History of loop electrical excision procedure (LEEP) 1996   HPV (human papilloma virus) infection    condyloma    Past Surgical History:  Procedure Laterality Date   GYNECOLOGIC CRYOSURGERY  1984   HYSTEROSCOPY WITH D & C  2006   KNEE ARTHROSCOPY WITH LATERAL MENISECTOMY Left 05/15/2020   Procedure: KNEE ARTHROSCOPY WITH DEBRIDEMENT (CHONDROPLASTY) AND  LATERAL MENISECTOMY;  Surgeon: Bjorn Pippin, MD;  Location: [facility];  Service: Orthopedics;  Laterality: Left;   LEEP  1996   CIN 1   NOVASURE  ABLATION  7/07   RECTAL POLYPECTOMY  2001   anal polyp   SKIN BIOPSY     area froze off-left shoulder   STERIOD INJECTION Right 05/15/2020   Procedure: INJECTION ASPIRATION ARTHROCENTESIS INTERMEDULLARY JOINT BURSA;  Surgeon: Bjorn Pippin, MD;  Location: [facility];  Service: Orthopedics;  Laterality: Right;   VARICOSE VEIN SURGERY     WISDOM TOOTH EXTRACTION      Current Outpatient Medications  Medication Sig Dispense Refill   cetirizine (ZYRTEC) 10 MG tablet Take 10 mg by mouth daily. As needed     cholecalciferol (VITAMIN D) 1000 units tablet Take 1,000 Units by mouth daily.     fluticasone (FLONASE) 50 MCG/ACT nasal spray Place 2 sprays into the nose as needed.      MAGNESIUM PO Take by mouth daily.      polyethylene glycol (MIRALAX / GLYCOLAX) packet Take 17 g by mouth daily.     estradiol (ESTRACE) 0.1 MG/GM vaginal cream Insert 1 gram vaginally and apply topically twice weekly as directed. 42.5 g 3   estradiol (ESTRING) 7.5 MCG/24HR vaginal ring Place 1 each vaginally every 3 (three) months. 1 each 3   No current facility-administered medications for this visit.    Family History  Problem Relation Age of Onset   Endometrial cancer Maternal Grandmother  Hypertension Mother    Coronary artery disease Mother    Bipolar disorder Mother    Osteoporosis Mother    Pancreatic cancer Maternal Grandfather    Hypertension Father    Coronary artery disease Father    Melanoma Father    Hypertension Other        all grandparents   Bipolar disorder Brother     ROS: Constitutional: negative Genitourinary:negative  Exam:   BP 109/66 (BP Location: Left Arm, Patient Position: Sitting, Cuff Size: Large)   Pulse 68   Ht 5' 8.5" (1.74 m)   Wt 150 lb 9.6 oz (68.3 kg)   LMP 04/22/2017 (Approximate)   BMI 22.57 kg/m   Height: 5' 8.5" (174 cm)  General appearance: alert, cooperative and appears stated age Head: Normocephalic, without obvious abnormality,  atraumatic Neck: no adenopathy, supple, symmetrical, trachea midline and thyroid normal to inspection and palpation Lungs: clear to auscultation bilaterally Breasts: normal appearance, no masses or tenderness Heart: regular rate and rhythm Abdomen: soft, non-tender; bowel sounds normal; no masses,  no organomegaly Extremities: extremities normal, atraumatic, no cyanosis or edema Skin: Skin color, texture, turgor normal. No rashes or lesions Lymph nodes: Cervical, supraclavicular, and axillary nodes normal. No abnormal inguinal nodes palpated Neurologic: Grossly normal   Pelvic: External genitalia:  no lesions              Urethra:  normal appearing urethra with no masses, tenderness or lesions              Bartholins and Skenes: normal                 Vagina: normal appearing vagina with normal color and no discharge, no lesions              Cervix: no lesions, scarring present              Pap taken: Yes.   Bimanual Exam:  Uterus:  normal size, contour, position, consistency, mobility, non-tender              Adnexa: normal adnexa and no mass, fullness, tenderness               Rectovaginal: Confirms               Anus:  normal sphincter tone, no lesions  Chaperone, Ina Homes, CMA, was present for exam.  Assessment/Plan: .1. Well woman exam with routine gynecological exam - Pap smear updated today.  Has new insurance.  Hx of LEEP and will be past three years when I see her next - Mammogram 04/21/2022 - Colonoscopy 2021 or 2022 with Dr. Ewing Schlein, follow up 5 years - Bone mineral density 12/222, osteopenia - lab work done with PCP - vaccines reviewed/updated  2. Vaginal atrophy - estradiol (ESTRACE) 0.1 MG/GM vaginal cream; Insert 1 gram vaginally and apply topically twice weekly as directed.  Dispense: 42.5 g; Refill: 3 - estradiol (ESTRING) 7.5 MCG/24HR vaginal ring; Place 1 each vaginally every 3 (three) months.  Dispense: 1 each; Refill: 3  3. Cervical cancer screening -  Cytology - PAP( [facility])  4. History of loop electrical excision procedure (LEEP) - in 1996  5. Osteopenia of neck of left femur - recommended repeating BMD next year

## 2022-09-09 LAB — CYTOLOGY - PAP
Comment: NEGATIVE
Diagnosis: NEGATIVE
High risk HPV: NEGATIVE

## 2022-09-16 ENCOUNTER — Encounter (HOSPITAL_BASED_OUTPATIENT_CLINIC_OR_DEPARTMENT_OTHER): Payer: Self-pay | Admitting: Obstetrics & Gynecology

## 2022-09-17 ENCOUNTER — Other Ambulatory Visit (HOSPITAL_BASED_OUTPATIENT_CLINIC_OR_DEPARTMENT_OTHER): Payer: Self-pay | Admitting: *Deleted

## 2022-09-17 DIAGNOSIS — N952 Postmenopausal atrophic vaginitis: Secondary | ICD-10-CM

## 2022-09-17 MED ORDER — ESTRADIOL 7.5 MCG/24HR VA RING: 1.0000 | VAGINAL_RING | VAGINAL | 3 refills | Status: DC

## 2022-09-17 NOTE — Telephone Encounter (Signed)
Called pt in response to [employer]. Advised that Dr. Hyacinth Meeker suggests that she can try yuvifem in place of estradiol cream, it is less messy. Pt states that she doesn't mind using a cream, would just like something a little more effective. Pt would like Rx printed so that she can try to find the best price for the estring.

## 2022-09-17 NOTE — Progress Notes (Signed)
Pt requests paper Rx for estring

## 2022-11-23 HISTORY — PX: FACIAL COSMETIC SURGERY: SHX629

## 2023-09-15 ENCOUNTER — Ambulatory Visit (HOSPITAL_BASED_OUTPATIENT_CLINIC_OR_DEPARTMENT_OTHER): Payer: No Typology Code available for payment source | Admitting: Obstetrics & Gynecology

## 2023-09-15 ENCOUNTER — Encounter (HOSPITAL_BASED_OUTPATIENT_CLINIC_OR_DEPARTMENT_OTHER): Payer: Self-pay | Admitting: Obstetrics & Gynecology

## 2023-09-15 VITALS — BP 100/63 | HR 69 | Wt 150.4 lb

## 2023-09-15 DIAGNOSIS — N952 Postmenopausal atrophic vaginitis: Secondary | ICD-10-CM

## 2023-09-15 DIAGNOSIS — Z01419 Encounter for gynecological examination (general) (routine) without abnormal findings: Secondary | ICD-10-CM | POA: Diagnosis not present

## 2023-09-15 DIAGNOSIS — N393 Stress incontinence (female) (male): Secondary | ICD-10-CM

## 2023-09-15 DIAGNOSIS — Z9889 Other specified postprocedural states: Secondary | ICD-10-CM

## 2023-09-15 DIAGNOSIS — N941 Unspecified dyspareunia: Secondary | ICD-10-CM | POA: Diagnosis not present

## 2023-09-15 NOTE — Progress Notes (Signed)
 ANNUAL EXAM Patient name: Taylor Townsend MRN [id number]  Date of birth: [birth year] Chief Complaint:   Gynecologic Exam (Hot flashes are coming about more frequent now, having between 8-10 episodes in a 24 hr period )  History of Present Illness:   Taylor Townsend is a 58 y.o. G32P1001 Caucasian female being seen today for a routine annual exam.  Denies vaginal bleeding.  Tried Estring  last year.  Didn't work well for her so she did not get this refilled.    Sister had a second pancreatic primary.  Recent genetic testing for her sister showed BRCA 2 gene mutation.  Pt is scheduled July 30 for genetic counseling.  She is going to share results with me.    Patient's last menstrual period was 04/22/2017 (approximate).   Last pap 09/07/2022. Results were: NILM w/ HRHPV negative. H/O abnormal pap: yes h/o LEEP Last mammogram: 05/18/2022. Results were: normal. Family h/o breast cancer: no Last colonoscopy: 08/21/2020. Results were: normal. Family h/o colorectal cancer: no DEXA:  2022.  T score -1.8.       09/07/2022    1:57 PM 06/30/2020    1:24 PM  Depression screen PHQ 2/9  Decreased Interest 0 0  Down, Depressed, Hopeless 0 0  PHQ - 2 Score 0 0    Review of Systems:   Pertinent items are noted in HPI Denies any urinary or bowel changes.  Denies pelvic pain.   Pertinent History Reviewed:  Reviewed past medical,surgical, social and family history.  Reviewed problem list, medications and allergies. Physical Assessment:   Vitals:   09/15/23 1415  BP: 100/63  Pulse: 69  SpO2: 100%  There is no height or weight on file to calculate BMI.        Physical Examination:   General appearance - well appearing, and in no distress  Mental status - alert, oriented to person, place, and time  Psych:  She has a normal mood and affect  Skin - warm and dry, normal color, no suspicious lesions noted  Chest - effort normal, all lung fields clear to auscultation bilaterally  Heart -  normal rate and regular rhythm  Neck:  midline trachea, no thyromegaly or nodules  Breasts - breasts appear normal, no suspicious masses, no skin or nipple changes or  axillary nodes  Abdomen - soft, nontender, nondistended, no masses or organomegaly  Pelvic - VULVA: normal appearing vulva with no masses, tenderness or lesions   VAGINA: normal appearing vagina with normal color and discharge, no lesions  CERVIX: normal appearing cervix without discharge or lesions, no CMT  Thin prep pap is not done  UTERUS: uterus is felt to be normal size, shape, consistency and nontender   ADNEXA: No adnexal masses or tenderness noted.  Rectal - normal rectal, good sphincter tone, no masses felt.  Extremities:  No swelling or varicosities noted  Chaperone present for exam  No results found for this or any previous visit (from the past 24 hours).  Assessment & Plan:  1. Well woman exam with routine gynecological exam (Primary) - Pap smear neg with neg HR HPV 2024 - Mammogram 04/2023 - Colonoscopy 2022.  F/u 10 years. - Bone mineral density 01/2021 - lab work done with PCP - vaccines reviewed/updated  2. History of loop electrical excision procedure (LEEP)  3. Vaginal atrophy - has used  multiple different products without significant improvement.   4. 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 SUI (stress urinary incontinence, female) - she does not want a  referral at this time   No orders of the defined types were placed in this encounter.   Meds: No orders of the defined types were placed in this encounter.   Follow-up: Return in about 1 year (around 09/14/2024).  Ronal GORMAN Pinal, MD 09/15/2023 3:16 PM

## 2023-11-04 ENCOUNTER — Encounter (HOSPITAL_BASED_OUTPATIENT_CLINIC_OR_DEPARTMENT_OTHER): Payer: Self-pay | Admitting: Obstetrics & Gynecology

## 2023-11-29 ENCOUNTER — Other Ambulatory Visit (HOSPITAL_BASED_OUTPATIENT_CLINIC_OR_DEPARTMENT_OTHER): Payer: Self-pay | Admitting: Obstetrics & Gynecology

## 2023-11-29 MED ORDER — ESTRADIOL 0.1 MG/GM VA CREA: TOPICAL_CREAM | VAGINAL | 3 refills | Status: AC

## 2024-09-18 ENCOUNTER — Ambulatory Visit (HOSPITAL_BASED_OUTPATIENT_CLINIC_OR_DEPARTMENT_OTHER): Admitting: Obstetrics & Gynecology
